# Patient Record
Sex: Female | Born: 1995 | Race: White | Hispanic: No | Marital: Single | Smoking: Never smoker
Health system: Southern US, Community
[De-identification: ages and names within clinical notes are randomized; demographics above are authoritative.]

## PROBLEM LIST (undated history)

## (undated) DIAGNOSIS — F419 Anxiety disorder, unspecified: Secondary | ICD-10-CM

## (undated) DIAGNOSIS — F988 Other specified behavioral and emotional disorders with onset usually occurring in childhood and adolescence: Secondary | ICD-10-CM

## (undated) DIAGNOSIS — F329 Major depressive disorder, single episode, unspecified: Secondary | ICD-10-CM

## (undated) HISTORY — DX: Major depressive disorder, single episode, unspecified: F32.9

## (undated) HISTORY — PX: MOUTH SURGERY: SHX715

---
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
## 2016-01-03 ENCOUNTER — Other Ambulatory Visit: Payer: Self-pay | Admitting: Obstetrics & Gynecology

## 2016-01-06 ENCOUNTER — Ambulatory Visit (INDEPENDENT_AMBULATORY_CARE_PROVIDER_SITE_OTHER): Payer: Managed Care, Other (non HMO) | Admitting: Family

## 2016-01-06 VITALS — BP 106/70 | HR 84 | Temp 97.8°F | Wt 142.0 lb

## 2016-01-06 DIAGNOSIS — Z3402 Encounter for supervision of normal first pregnancy, second trimester: Secondary | ICD-10-CM

## 2016-01-06 DIAGNOSIS — J029 Acute pharyngitis, unspecified: Secondary | ICD-10-CM

## 2016-01-06 DIAGNOSIS — O219 Vomiting of pregnancy, unspecified: Secondary | ICD-10-CM

## 2016-01-06 MED ORDER — AZITHROMYCIN 250 MG PO TABS: ORAL_TABLET | ORAL | 0 refills | Status: DC

## 2016-01-06 MED ORDER — PROMETHAZINE HCL 12.5 MG PO TABS: 12.5000 mg | ORAL_TABLET | Freq: Four times a day (QID) | ORAL | 1 refills | Status: DC | PRN

## 2016-01-06 NOTE — Progress Notes (Signed)
   PRENATAL VISIT NOTE  Subjective:  Amanda Simmons is a 20 y.o. G1P0 at 167w4d being seen today for ongoing prenatal care.  She is currently monitored for the following issues for this low-risk pregnancy and has Seborrhea-like dermatitis with psoriasiform elements; Attention deficit hyperactivity disorder (ADHD); Pregnancy; Nausea without vomiting; Constipation; Depression; Supervision of normal first pregnancy; Depressed mood; and Bereavement on her problem list.  Patient reports sore throat, coughing, and sneezing x 3 days.  Contractions: Not present. Vag. Bleeding: None.  Movement: Present. Denies leaking of fluid.   The following portions of the patient's history were reviewed and updated as appropriate: allergies, current medications, past family history, past medical history, past social history, past surgical history and problem list. Problem list updated.  Objective:   Vitals:   01/06/16 0956  BP: 106/70  Pulse: 84  Temp: 97.8 F (36.6 C)  Weight: 142 lb (64.4 kg)    Fetal Status: Fetal Heart Rate (bpm): 141  Fundal Height: 27 cm Movement: Present     General:  Alert, oriented and cooperative. Patient is in no acute distress.  HEENT Throat red, +cobblestoning  Skin: Skin is warm and dry. No rash noted.   Cardiovascular: Normal heart rate noted  Respiratory: Normal respiratory effort, no problems with respiration noted; lungs clear throughout auscultation  Abdomen: Soft, gravid, appropriate for gestational age. Pain/Pressure: Present     Pelvic:  Cervical exam deferred        Extremities: Normal range of motion.  Edema: None  Mental Status: Normal mood and affect. Normal behavior. Normal judgment and thought content.   Assessment and Plan:  Pregnancy: G1P0 at 397w4d  1. Encounter for supervision of normal first pregnancy in second trimester - Plans to return for third trimester labs  2. Pharyngitis, unspecified etiology - azithromycin (ZITHROMAX Z-PAK) 250 MG tablet;  Take two pills on day one, then one pill a day x 4 days.  Dispense: 6 tablet; Refill: 0  3. Nausea and vomiting in pregnancy - promethazine (PHENERGAN) 12.5 MG tablet; Take 1 tablet (12.5 mg total) by mouth every 6 (six) hours as needed for nausea.  Dispense: 30 tablet; Refill: 1  Preterm labor symptoms and general obstetric precautions including but not limited to vaginal bleeding, contractions, leaking of fluid and fetal movement were reviewed in detail with the patient. Please refer to After Visit Summary for other counseling recommendations.  Return in about 2 weeks (around 01/20/2016).   Eino FarberWalidah Kennith GainN Karim, CNM

## 2016-01-06 NOTE — Patient Instructions (Signed)

## 2016-01-20 ENCOUNTER — Ambulatory Visit (INDEPENDENT_AMBULATORY_CARE_PROVIDER_SITE_OTHER): Payer: Managed Care, Other (non HMO) | Admitting: Advanced Practice Midwife

## 2016-01-20 VITALS — BP 120/76 | HR 92 | Wt 150.0 lb

## 2016-01-20 DIAGNOSIS — Z3492 Encounter for supervision of normal pregnancy, unspecified, second trimester: Secondary | ICD-10-CM

## 2016-01-20 LAB — CBC
HCT: 28.3 % — ABNORMAL LOW (ref 35.0–45.0)
Hemoglobin: 9.4 g/dL — ABNORMAL LOW (ref 11.7–15.5)
MCH: 27.8 pg (ref 27.0–33.0)
MCHC: 33.2 g/dL (ref 32.0–36.0)
MCV: 83.7 fL (ref 80.0–100.0)
MPV: 9.6 fL (ref 7.5–12.5)
PLATELETS: 238 10*3/uL (ref 140–400)
RBC: 3.38 MIL/uL — AB (ref 3.80–5.10)
RDW: 14.1 % (ref 11.0–15.0)
WBC: 7.8 10*3/uL (ref 3.8–10.8)

## 2016-01-20 NOTE — Patient Instructions (Signed)
Third Trimester of Pregnancy The third trimester is from week 29 through week 40 (months 7 through 9). The third trimester is a time when the unborn baby (fetus) is growing rapidly. At the end of the ninth month, the fetus is about 20 inches in length and weighs 6-10 pounds. Body changes during your third trimester Your body goes through many changes during pregnancy. The changes vary from woman to woman. During the third trimester:  Your weight will continue to increase. You can expect to gain 25-35 pounds (11-16 kg) by the end of the pregnancy.  You may begin to get stretch marks on your hips, abdomen, and breasts.  You may urinate more often because the fetus is moving lower into your pelvis and pressing on your bladder.  You may develop or continue to have heartburn. This is caused by increased hormones that slow down muscles in the digestive tract.  You may develop or continue to have constipation because increased hormones slow digestion and cause the muscles that push waste through your intestines to relax.  You may develop hemorrhoids. These are swollen veins (varicose veins) in the rectum that can itch or be painful.  You may develop swollen, bulging veins (varicose veins) in your legs.  You may have increased body aches in the pelvis, back, or thighs. This is due to weight gain and increased hormones that are relaxing your joints.  You may have changes in your hair. These can include thickening of your hair, rapid growth, and changes in texture. Some women also have hair loss during or after pregnancy, or hair that feels dry or thin. Your hair will most likely return to normal after your baby is born.  Your breasts will continue to grow and they will continue to become tender. A yellow fluid (colostrum) may leak from your breasts. This is the first milk you are producing for your baby.  Your belly button may stick out.  You may notice more swelling in your hands, face, or  ankles.  You may have increased tingling or numbness in your hands, arms, and legs. The skin on your belly may also feel numb.  You may feel short of breath because of your expanding uterus.  You may have more problems sleeping. This can be caused by the size of your belly, increased need to urinate, and an increase in your body's metabolism.  You may notice the fetus "dropping," or moving lower in your abdomen.  You may have increased vaginal discharge.  Your cervix becomes thin and soft (effaced) near your due date. What to expect at prenatal visits You will have prenatal exams every 2 weeks until week 36. Then you will have weekly prenatal exams. During a routine prenatal visit:  You will be weighed to make sure you and the fetus are growing normally.  Your blood pressure will be taken.  Your abdomen will be measured to track your baby's growth.  The fetal heartbeat will be listened to.  Any test results from the previous visit will be discussed.  You may have a cervical check near your due date to see if you have effaced. At around 36 weeks, your health care provider will check your cervix. At the same time, your health care provider will also perform a test on the secretions of the vaginal tissue. This test is to determine if a type of bacteria, Group B streptococcus, is present. Your health care provider will explain this further. Your health care provider may ask you:    What your birth plan is.  How you are feeling.  If you are feeling the baby move.  If you have had any abnormal symptoms, such as leaking fluid, bleeding, severe headaches, or abdominal cramping.  If you are using any tobacco products, including cigarettes, chewing tobacco, and electronic cigarettes.  If you have any questions. Other tests or screenings that may be performed during your third trimester include:  Blood tests that check for low iron levels (anemia).  Fetal testing to check the health,  activity level, and growth of the fetus. Testing is done if you have certain medical conditions or if there are problems during the pregnancy.  Nonstress test (NST). This test checks the health of your baby to make sure there are no signs of problems, such as the baby not getting enough oxygen. During this test, a belt is placed around your belly. The baby is made to move, and its heart rate is monitored during movement. What is false labor? False labor is a condition in which you feel small, irregular tightenings of the muscles in the womb (contractions) that eventually go away. These are called Braxton Hicks contractions. Contractions may last for hours, days, or even weeks before true labor sets in. If contractions come at regular intervals, become more frequent, increase in intensity, or become painful, you should see your health care provider. What are the signs of labor?  Abdominal cramps.  Regular contractions that start at 10 minutes apart and become stronger and more frequent with time.  Contractions that start on the top of the uterus and spread down to the lower abdomen and back.  Increased pelvic pressure and dull back pain.  A watery or bloody mucus discharge that comes from the vagina.  Leaking of amniotic fluid. This is also known as your "water breaking." It could be a slow trickle or a gush. Let your doctor know if it has a color or strange odor. If you have any of these signs, call your health care provider right away, even if it is before your due date. Follow these instructions at home: Eating and drinking  Continue to eat regular, healthy meals.  Do not eat:  Raw meat or meat spreads.  Unpasteurized milk or cheese.  Unpasteurized juice.  Store-made salad.  Refrigerated smoked seafood.  Hot dogs or deli meat, unless they are piping hot.  More than 6 ounces of albacore tuna a week.  Shark, swordfish, king mackerel, or tile fish.  Store-made salads.  Raw  sprouts, such as mung bean or alfalfa sprouts.  Take prenatal vitamins as told by your health care provider.  Take 1000 mg of calcium daily as told by your health care provider.  If you develop constipation:  Take over-the-counter or prescription medicines.  Drink enough fluid to keep your urine clear or pale yellow.  Eat foods that are high in fiber, such as fresh fruits and vegetables, whole grains, and beans.  Limit foods that are high in fat and processed sugars, such as fried and sweet foods. Activity  Exercise only as directed by your health care provider. Healthy pregnant women should aim for 2 hours and 30 minutes of moderate exercise per week. If you experience any pain or discomfort while exercising, stop.  Avoid heavy lifting.  Do not exercise in extreme heat or humidity, or at high altitudes.  Wear low-heel, comfortable shoes.  Practice good posture.  Do not travel far distances unless it is absolutely necessary and only with the approval   of your health care provider.  Wear your seat belt at all times while in a car, on a bus, or on a plane.  Take frequent breaks and rest with your legs elevated if you have leg cramps or low back pain.  Do not use hot tubs, steam rooms, or saunas.  You may continue to have sex unless your health care provider tells you otherwise. Lifestyle  Do not use any products that contain nicotine or tobacco, such as cigarettes and e-cigarettes. If you need help quitting, ask your health care provider.  Do not drink alcohol.  Do not use any medicinal herbs or unprescribed drugs. These chemicals affect the formation and growth of the baby.  If you develop varicose veins:  Wear support pantyhose or compression stockings as told by your healthcare provider.  Elevate your feet for 15 minutes, 3-4 times a day.  Wear a supportive maternity bra to help with breast tenderness. General instructions  Take over-the-counter and prescription  medicines only as told by your health care provider. There are medicines that are either safe or unsafe to take during pregnancy.  Take warm sitz baths to soothe any pain or discomfort caused by hemorrhoids. Use hemorrhoid cream or witch hazel if your health care provider approves.  Avoid cat litter boxes and soil used by cats. These carry germs that can cause birth defects in the baby. If you have a cat, ask someone to clean the litter box for you.  To prepare for the arrival of your baby:  Take prenatal classes to understand, practice, and ask questions about the labor and delivery.  Make a trial run to the hospital.  Visit the hospital and tour the maternity area.  Arrange for maternity or paternity leave through employers.  Arrange for family and friends to take care of pets while you are in the hospital.  Purchase a rear-facing car seat and make sure you know how to install it in your car.  Pack your hospital bag.  Prepare the baby's nursery. Make sure to remove all pillows and stuffed animals from the baby's crib to prevent suffocation.  Visit your dentist if you have not gone during your pregnancy. Use a soft toothbrush to brush your teeth and be gentle when you floss.  Keep all prenatal follow-up visits as told by your health care provider. This is important. Contact a health care provider if:  You are unsure if you are in labor or if your water has broken.  You become dizzy.  You have mild pelvic cramps, pelvic pressure, or nagging pain in your abdominal area.  You have lower back pain.  You have persistent nausea, vomiting, or diarrhea.  You have an unusual or bad smelling vaginal discharge.  You have pain when you urinate. Get help right away if:  You have a fever.  You are leaking fluid from your vagina.  You have spotting or bleeding from your vagina.  You have severe abdominal pain or cramping.  You have rapid weight loss or weight gain.  You have  shortness of breath with chest pain.  You notice sudden or extreme swelling of your face, hands, ankles, feet, or legs.  Your baby makes fewer than 10 movements in 2 hours.  You have severe headaches that do not go away with medicine.  You have vision changes. Summary  The third trimester is from week 29 through week 40, months 7 through 9. The third trimester is a time when the unborn baby (fetus)   is growing rapidly.  During the third trimester, your discomfort may increase as you and your baby continue to gain weight. You may have abdominal, leg, and back pain, sleeping problems, and an increased need to urinate.  During the third trimester your breasts will keep growing and they will continue to become tender. A yellow fluid (colostrum) may leak from your breasts. This is the first milk you are producing for your baby.  False labor is a condition in which you feel small, irregular tightenings of the muscles in the womb (contractions) that eventually go away. These are called Braxton Hicks contractions. Contractions may last for hours, days, or even weeks before true labor sets in.  Signs of labor can include: abdominal cramps; regular contractions that start at 10 minutes apart and become stronger and more frequent with time; watery or bloody mucus discharge that comes from the vagina; increased pelvic pressure and dull back pain; and leaking of amniotic fluid. This information is not intended to replace advice given to you by your health care provider. Make sure you discuss any questions you have with your health care provider. Document Released: 01/16/2001 Document Revised: 06/30/2015 Document Reviewed: 03/25/2012 Elsevier Interactive Patient Education  2017 [employer]  

## 2016-01-20 NOTE — Progress Notes (Signed)
   PRENATAL VISIT NOTE  Subjective:  Amanda Simmons is a 20 y.o. G1P0 at 6875w4d being seen today for ongoing prenatal care.  She is currently monitored for the following issues for this low-risk pregnancy and has Seborrhea-like dermatitis with psoriasiform elements; Attention deficit hyperactivity disorder (ADHD); Pregnancy; Nausea without vomiting; Constipation; Depression; Supervision of normal first pregnancy; Depressed mood; and Bereavement on her problem list.  Patient reports nausea since glucose drink for GTT.  Contractions: Not present. Vag. Bleeding: None.  Movement: Present. Denies leaking of fluid.   The following portions of the patient's history were reviewed and updated as appropriate: allergies, current medications, past family history, past medical history, past social history, past surgical history and problem list. Problem list updated.  Objective:   Vitals:   01/20/16 0853  BP: 120/76  Pulse: 92  Weight: 150 lb (68 kg)    Fetal Status: Fetal Heart Rate (bpm): 146 Fundal Height: 27 cm Movement: Present     General:  Alert, oriented and cooperative. Patient is in no acute distress.  Skin: Skin is warm and dry. No rash noted.   Cardiovascular: Normal heart rate noted  Respiratory: Normal respiratory effort, no problems with respiration noted  Abdomen: Soft, gravid, appropriate for gestational age. Pain/Pressure: Absent     Pelvic:  Cervical exam deferred        Extremities: Normal range of motion.  Edema: None  Mental Status: Normal mood and affect. Normal behavior. Normal judgment and thought content.   Assessment and Plan:  Pregnancy: G1P0 at 4475w4d  1. Normal pregnancy in second trimester  - Glucose Tolerance, 2 Hours w/1 Hour - HIV antibody (with reflex) - RPR - CBC - Tdap vaccine greater than or equal to 7yo IM  Preterm labor symptoms and general obstetric precautions including but not limited to vaginal bleeding, contractions, leaking of fluid and  fetal movement were reviewed in detail with the patient. Please refer to After Visit Summary for other counseling recommendations.  Return in about 2 weeks (around 02/03/2016).   Amanda Simmons, CNM

## 2016-01-21 LAB — RPR

## 2016-01-21 LAB — GLUCOSE TOLERANCE, 2 HOURS W/ 1HR
Glucose, 1 hour: 122 mg/dL
Glucose, 2 hour: 91 mg/dL (ref <140)
Glucose, Fasting: 70 mg/dL (ref 65–99)

## 2016-01-21 LAB — HIV ANTIBODY (ROUTINE TESTING W REFLEX): HIV: NONREACTIVE

## 2016-01-23 ENCOUNTER — Telehealth: Payer: Self-pay | Admitting: *Deleted

## 2016-01-23 NOTE — Telephone Encounter (Signed)
LM on voicemail of normal 2 hr GTt and her Hgb is low @ 9.4  She is encouraged to add Fe so4 to her daily mes.

## 2016-02-06 NOTE — L&D Delivery Note (Signed)
Delivery Note At 7:44 PM a viable female was delivered via Vaginal, Spontaneous Delivery (presentation: vertex ; ROA).  APGAR: 8, 9; weight pending.   Placenta status: spontaneous, intact .  Cord: 3 vessel, 60s delay on clamping.  With no complications: .  Cord pH: n/a Sterile attire worn for the entire procedure Anesthesia:  Epidural + lidocaine Episiotomy: None Lacerations: Labial Suture Repair: 4.0 monocryl Est. Blood Loss (mL):  100  Mom to postpartum.  Baby to Couplet care / Skin to Skin.  Nehemiah SettleAna Carvalho do Silvio ClaymanAmaral MD PGY1 04/14/2016, 8:07 PM  OB FELLOW DELIVERY ATTESTATION  I was gloved and present for the delivery in its entirety, and I agree with the above resident's note.    Jen MowElizabeth Zaliyah Meikle, DO OB Fellow

## 2016-02-08 ENCOUNTER — Other Ambulatory Visit: Payer: Self-pay | Admitting: *Deleted

## 2016-02-08 DIAGNOSIS — O219 Vomiting of pregnancy, unspecified: Secondary | ICD-10-CM

## 2016-02-08 MED ORDER — PROMETHAZINE HCL 12.5 MG PO TABS: 12.5000 mg | ORAL_TABLET | Freq: Four times a day (QID) | ORAL | 1 refills | Status: DC | PRN

## 2016-02-08 NOTE — Telephone Encounter (Signed)
Pt called with request for RF on Phenergan.  Per Dr Marice Potterove RF sent to [facility] in [place].

## 2016-02-09 ENCOUNTER — Ambulatory Visit (INDEPENDENT_AMBULATORY_CARE_PROVIDER_SITE_OTHER): Payer: Managed Care, Other (non HMO) | Admitting: Obstetrics & Gynecology

## 2016-02-09 VITALS — BP 113/69 | HR 88 | Wt 152.0 lb

## 2016-02-09 DIAGNOSIS — Z3403 Encounter for supervision of normal first pregnancy, third trimester: Secondary | ICD-10-CM

## 2016-02-09 NOTE — Progress Notes (Signed)
   PRENATAL VISIT NOTE  Subjective:  Amanda Simmons is a 21 y.o. G1P0 at 8157w3d being seen today for ongoing prenatal care.  She is currently monitored for the following issues for this low-risk pregnancy and has Seborrhea-like dermatitis with psoriasiform elements; Attention deficit hyperactivity disorder (ADHD); Pregnancy; Nausea without vomiting; Constipation; Depression; Supervision of normal first pregnancy; Depressed mood; and Bereavement on her problem list.  Patient reports heartburn.  Contractions: Not present. Vag. Bleeding: None.  Movement: Present. Denies leaking of fluid.   The following portions of the patient's history were reviewed and updated as appropriate: allergies, current medications, past family history, past medical history, past social history, past surgical history and problem list. Problem list updated.  Objective:   Vitals:   02/09/16 1422  BP: 113/69  Pulse: 88  Weight: 152 lb (68.9 kg)    Fetal Status: Fetal Heart Rate (bpm): 147   Movement: Present     General:  Alert, oriented and cooperative. Patient is in no acute distress.  Skin: Skin is warm and dry. No rash noted.   Cardiovascular: Normal heart rate noted  Respiratory: Normal respiratory effort, no problems with respiration noted  Abdomen: Soft, gravid, appropriate for gestational age. Pain/Pressure: Present     Pelvic:  Cervical exam deferred        Extremities: Normal range of motion.  Edema: Trace  Mental Status: Normal mood and affect. Normal behavior. Normal judgment and thought content.   Assessment and Plan:  Pregnancy: G1P0 at 6557w3d  1. Encounter for supervision of normal first pregnancy in third trimester - Rec trial of zantac OTC qhs  Preterm labor symptoms and general obstetric precautions including but not limited to vaginal bleeding, contractions, leaking of fluid and fetal movement were reviewed in detail with the patient. Please refer to After Visit Summary for other  counseling recommendations.  No Follow-up on file.   Allie BossierMyra C Marilyn Nihiser, MD

## 2016-02-22 ENCOUNTER — Encounter: Payer: Self-pay | Admitting: Obstetrics & Gynecology

## 2016-02-29 ENCOUNTER — Ambulatory Visit (INDEPENDENT_AMBULATORY_CARE_PROVIDER_SITE_OTHER): Payer: Managed Care, Other (non HMO) | Admitting: Obstetrics & Gynecology

## 2016-02-29 VITALS — BP 118/73 | HR 84 | Wt 153.0 lb

## 2016-02-29 DIAGNOSIS — O9989 Other specified diseases and conditions complicating pregnancy, childbirth and the puerperium: Secondary | ICD-10-CM | POA: Diagnosis not present

## 2016-02-29 DIAGNOSIS — N898 Other specified noninflammatory disorders of vagina: Secondary | ICD-10-CM

## 2016-02-29 DIAGNOSIS — Z3403 Encounter for supervision of normal first pregnancy, third trimester: Secondary | ICD-10-CM

## 2016-02-29 NOTE — Progress Notes (Signed)
Pt states that discharge is thick and has an odor. She feels like she may have BV or yeast.

## 2016-02-29 NOTE — Progress Notes (Signed)
   PRENATAL VISIT NOTE  Subjective:  Amanda Simmons is a 21 y.o. SW G1P0 at 4850w2d being seen today for ongoing prenatal care.  Amanda Simmons is currently monitored for the following issues for this low-risk pregnancy and has Seborrhea-like dermatitis with psoriasiform elements; Attention deficit hyperactivity disorder (ADHD); Pregnancy; Nausea without vomiting; Constipation; Depression; Supervision of normal first pregnancy; Depressed mood; and Bereavement on her problem list.  Patient reports vaginal irritation.  Contractions: Not present. Vag. Bleeding: None.  Movement: Present. Denies leaking of fluid.   The following portions of the patient's history were reviewed and updated as appropriate: allergies, current medications, past family history, past medical history, past social history, past surgical history and problem list. Problem list updated.  Objective:   Vitals:   02/29/16 0831  BP: 118/73  Pulse: 84  Weight: 153 lb (69.4 kg)    Fetal Status:     Movement: Present     General:  Alert, oriented and cooperative. Patient is in no acute distress.  Skin: Skin is warm and dry. No rash noted.   Cardiovascular: Normal heart rate noted  Respiratory: Normal respiratory effort, no problems with respiration noted  Abdomen: Soft, gravid, appropriate for gestational age. Pain/Pressure: Present     Pelvic:  Cervical exam deferred       vaginal discharge appears normal but wet prep sent  Extremities: Normal range of motion.  Edema: None  Mental Status: Normal mood and affect. Normal behavior. Normal judgment and thought content.   Assessment and Plan:  Pregnancy: G1P0 at 6850w2d  1. Encounter for supervision of normal first pregnancy in third trimester - wet prep sent  Preterm labor symptoms and general obstetric precautions including but not limited to vaginal bleeding, contractions, leaking of fluid and fetal movement were reviewed in detail with the patient. Please refer to After Visit  Summary for other counseling recommendations.  No Follow-up on file.   Allie BossierMyra C Tamir Wallman, MD

## 2016-02-29 NOTE — Addendum Note (Signed)
Addended by: Kathie DikeSOLA, Paydon Carll J on: 02/29/2016 08:44 AM   Modules accepted: Orders

## 2016-03-01 LAB — CERVICOVAGINAL ANCILLARY ONLY
Bacterial vaginitis: NEGATIVE
CANDIDA VAGINITIS: NEGATIVE

## 2016-03-14 ENCOUNTER — Ambulatory Visit (INDEPENDENT_AMBULATORY_CARE_PROVIDER_SITE_OTHER): Payer: Managed Care, Other (non HMO) | Admitting: Obstetrics & Gynecology

## 2016-03-14 DIAGNOSIS — O219 Vomiting of pregnancy, unspecified: Secondary | ICD-10-CM

## 2016-03-14 DIAGNOSIS — Z3403 Encounter for supervision of normal first pregnancy, third trimester: Secondary | ICD-10-CM

## 2016-03-14 MED ORDER — PROMETHAZINE HCL 12.5 MG PO TABS: 12.5000 mg | ORAL_TABLET | Freq: Four times a day (QID) | ORAL | 1 refills | Status: DC | PRN

## 2016-03-14 NOTE — Progress Notes (Signed)
   PRENATAL VISIT NOTE  Subjective:  Amanda Simmons is a 21 y.o. G1P0 at 717w2d being seen today for ongoing prenatal care.  She is currently monitored for the following issues for this low-risk pregnancy and has Seborrhea-like dermatitis with psoriasiform elements; Attention deficit hyperactivity disorder (ADHD); Nausea without vomiting; Constipation; Depression; Supervision of normal first pregnancy; and Bereavement on her problem list.  Patient reports no complaints.  Contractions: Not present. Vag. Bleeding: None.  Movement: Present. Denies leaking of fluid.   The following portions of the patient's history were reviewed and updated as appropriate: allergies, current medications, past family history, past medical history, past social history, past surgical history and problem list. Problem list updated.  Objective:   Vitals:   03/14/16 1036  BP: 121/75  Pulse: 95  Weight: 155 lb (70.3 kg)    Fetal Status: Fetal Heart Rate (bpm): 146   Movement: Present     General:  Alert, oriented and cooperative. Patient is in no acute distress.  Skin: Skin is warm and dry. No rash noted.   Cardiovascular: Normal heart rate noted  Respiratory: Normal respiratory effort, no problems with respiration noted  Abdomen: Soft, gravid, appropriate for gestational age. Pain/Pressure: Present     Pelvic:  Cervical exam deferred        Extremities: Normal range of motion.  Edema: None  Mental Status: Normal mood and affect. Normal behavior. Normal judgment and thought content.   Assessment and Plan:  Pregnancy: G1P0 at 547w2d  1. Encounter for supervision of normal first pregnancy in third trimester Cultures next visit  2. Nausea and vomiting in pregnancy - promethazine (PHENERGAN) 12.5 MG tablet; Take 1 tablet (12.5 mg total) by mouth every 6 (six) hours as needed for nausea.  Dispense: 30 tablet; Refill: 1  Preterm labor symptoms and general obstetric precautions including but not limited to  vaginal bleeding, contractions, leaking of fluid and fetal movement were reviewed in detail with the patient. Please refer to After Visit Summary for other counseling recommendations.  Return in about 1 year (around 03/14/2017).   Lesly DukesKelly H Alanson Hausmann, MD

## 2016-03-23 ENCOUNTER — Ambulatory Visit (INDEPENDENT_AMBULATORY_CARE_PROVIDER_SITE_OTHER): Payer: Managed Care, Other (non HMO) | Admitting: Advanced Practice Midwife

## 2016-03-23 ENCOUNTER — Other Ambulatory Visit (HOSPITAL_COMMUNITY)
Admission: RE | Admit: 2016-03-23 | Discharge: 2016-03-23 | Disposition: A | Discharge status: Home / Self Care | Payer: Managed Care, Other (non HMO) | Source: Ambulatory Visit | Attending: Advanced Practice Midwife | Admitting: Advanced Practice Midwife

## 2016-03-23 VITALS — BP 119/72 | HR 98 | Wt 155.0 lb

## 2016-03-23 DIAGNOSIS — Z113 Encounter for screening for infections with a predominantly sexual mode of transmission: Secondary | ICD-10-CM

## 2016-03-23 DIAGNOSIS — Z3493 Encounter for supervision of normal pregnancy, unspecified, third trimester: Secondary | ICD-10-CM | POA: Diagnosis not present

## 2016-03-23 LAB — OB RESULTS CONSOLE GBS: GBS: NEGATIVE

## 2016-03-23 MED ORDER — CYCLOBENZAPRINE HCL 5 MG PO TABS: 5.0000 mg | ORAL_TABLET | Freq: Three times a day (TID) | ORAL | 0 refills | Status: DC | PRN

## 2016-03-23 NOTE — Progress Notes (Signed)
neg

## 2016-03-23 NOTE — Patient Instructions (Signed)

## 2016-03-23 NOTE — Progress Notes (Signed)
   PRENATAL VISIT NOTE  Subjective:  Amanda Simmons is a 21 y.o. G1P0 at 6264w4d being seen today for ongoing prenatal care.  She is currently monitored for the following issues for this low-risk pregnancy and has Seborrhea-like dermatitis with psoriasiform elements; Attention deficit hyperactivity disorder (ADHD); Nausea without vomiting; Constipation; Depression; Supervision of normal first pregnancy; and Bereavement on her problem list.  Patient reports no complaints.  Contractions: Regular. Vag. Bleeding: None.  Movement: Present. Denies leaking of fluid.   The following portions of the patient's history were reviewed and updated as appropriate: allergies, current medications, past family history, past medical history, past social history, past surgical history and problem list. Problem list updated.  Objective:   Vitals:   03/23/16 1026  BP: 119/72  Pulse: 98  Weight: 155 lb (70.3 kg)    Fetal Status: Fetal Heart Rate (bpm): 143   Movement: Present     General:  Alert, oriented and cooperative. Patient is in no acute distress.  Skin: Skin is warm and dry. No rash noted.   Cardiovascular: Normal heart rate noted  Respiratory: Normal respiratory effort, no problems with respiration noted  Abdomen: Soft, gravid, appropriate for gestational age. Pain/Pressure: Present     Pelvic:  Cervical exam deferred        Extremities: Normal range of motion.  Edema: None  Mental Status: Normal mood and affect. Normal behavior. Normal judgment and thought content.   Assessment and Plan:  Pregnancy: G1P0 at 4264w4d  1. Normal pregnancy in third trimester  - Urine cytology ancillary only - Culture, beta strep (group b only)  Preterm labor symptoms and general obstetric precautions including but not limited to vaginal bleeding, contractions, leaking of fluid and fetal movement were reviewed in detail with the patient. Please refer to After Visit Summary for other counseling recommendations.    RTO 1 week  Aviva SignsMarie L Suvan Stcyr, CNM

## 2016-03-25 LAB — CULTURE, BETA STREP (GROUP B ONLY)

## 2016-03-26 LAB — URINE CYTOLOGY ANCILLARY ONLY
CHLAMYDIA, DNA PROBE: NEGATIVE
Neisseria Gonorrhea: NEGATIVE

## 2016-03-30 ENCOUNTER — Ambulatory Visit (INDEPENDENT_AMBULATORY_CARE_PROVIDER_SITE_OTHER): Payer: Managed Care, Other (non HMO) | Admitting: Advanced Practice Midwife

## 2016-03-30 VITALS — BP 118/79 | HR 95 | Wt 158.0 lb

## 2016-03-30 DIAGNOSIS — Z3403 Encounter for supervision of normal first pregnancy, third trimester: Secondary | ICD-10-CM

## 2016-03-30 NOTE — Patient Instructions (Signed)
Braxton Hicks Contractions °Contractions of the uterus can occur throughout pregnancy. Contractions are not always a sign that you are in labor.  °WHAT ARE BRAXTON HICKS CONTRACTIONS?  °Contractions that occur before labor are called Braxton Hicks contractions, or false labor. Toward the end of pregnancy (32-34 weeks), these contractions can develop more often and may become more forceful. This is not true labor because these contractions do not result in opening (dilatation) and thinning of the cervix. They are sometimes difficult to tell apart from true labor because these contractions can be forceful and people have different pain tolerances. You should not feel embarrassed if you go to the hospital with false labor. Sometimes, the only way to tell if you are in true labor is for your health care provider to look for changes in the cervix. °If there are no prenatal problems or other health problems associated with the pregnancy, it is completely safe to be sent home with false labor and await the onset of true labor. °HOW CAN YOU TELL THE DIFFERENCE BETWEEN TRUE AND FALSE LABOR? °False Labor  °· The contractions of false labor are usually shorter and not as hard as those of true labor.   °· The contractions are usually irregular.   °· The contractions are often felt in the front of the lower abdomen and in the groin.   °· The contractions may go away when you walk around or change positions while lying down.   °· The contractions get weaker and are shorter lasting as time goes on.   °· The contractions do not usually become progressively stronger, regular, and closer together as with true labor.   °True Labor  °· Contractions in true labor last 30-70 seconds, become very regular, usually become more intense, and increase in frequency.   °· The contractions do not go away with walking.   °· The discomfort is usually felt in the top of the uterus and spreads to the lower abdomen and low back.   °· True labor can be  determined by your health care provider with an exam. This will show that the cervix is dilating and getting thinner.   °WHAT TO REMEMBER °· Keep up with your usual exercises and follow other instructions given by your health care provider.   °· Take medicines as directed by your health care provider.   °· Keep your regular prenatal appointments.   °· Eat and drink lightly if you think you are going into labor.   °· If Braxton Hicks contractions are making you uncomfortable:   °¨ Change your position from lying down or resting to walking, or from walking to resting.   °¨ Sit and rest in a tub of warm water.   °¨ Drink 2-3 glasses of water. Dehydration may cause these contractions.   °¨ Do slow and deep breathing several times an hour.   °WHEN SHOULD I SEEK IMMEDIATE MEDICAL CARE? °Seek immediate medical care if: °· Your contractions become stronger, more regular, and closer together.   °· You have fluid leaking or gushing from your vagina.   °· You have a fever.   °· You pass blood-tinged mucus.   °· You have vaginal bleeding.   °· You have continuous abdominal pain.   °· You have low back pain that you never had before.   °· You feel your baby's head pushing down and causing pelvic pressure.   °· Your baby is not moving as much as it used to.   °This information is not intended to replace advice given to you by your health care provider. Make sure you discuss any questions you have with your health care   provider. °Document Released: 01/22/2005 Document Revised: 05/16/2015 Document Reviewed: 11/03/2012 °Elsevier Interactive Patient Education © 2017 Elsevier Inc[employer] ° °

## 2016-03-30 NOTE — Progress Notes (Signed)
   PRENATAL VISIT NOTE  Subjective:  Amanda Simmons is a 21 y.o. G1P0 at 10496w4d being seen today for ongoing prenatal care.  She is currently monitored for the following issues for this low-risk pregnancy and has Seborrhea-like dermatitis with psoriasiform elements; Attention deficit hyperactivity disorder (ADHD); Nausea without vomiting; Constipation; Depression; Supervision of normal first pregnancy; and Bereavement on her problem list.  Patient reports occasional contractions.  Contractions: Irregular. Vag. Bleeding: None.  Movement: Present. Denies leaking of fluid.   The following portions of the patient's history were reviewed and updated as appropriate: allergies, current medications, past family history, past medical history, past social history, past surgical history and problem list. Problem list updated.  Objective:   Vitals:   03/30/16 1042  BP: 118/79  Pulse: 95  Weight: 158 lb (71.7 kg)    Fetal Status: Fetal Heart Rate (bpm): 148 Fundal Height: 38 cm Movement: Present  Presentation: Vertex  General:  Alert, oriented and cooperative. Patient is in no acute distress.  Skin: Skin is warm and dry. No rash noted.   Cardiovascular: Normal heart rate noted  Respiratory: Normal respiratory effort, no problems with respiration noted  Abdomen: Soft, gravid, appropriate for gestational age. Pain/Pressure: Present     Pelvic:  Cervical exam deferred        Extremities: Normal range of motion.  Edema: None  Mental Status: Normal mood and affect. Normal behavior. Normal judgment and thought content.  Reviewed neg GBS Assessment and Plan:  Pregnancy: G1P0 at 4696w4d  1. Encounter for supervision of normal first pregnancy in third trimester   Term labor symptoms and general obstetric precautions including but not limited to vaginal bleeding, contractions, leaking of fluid and fetal movement were reviewed in detail with the patient. Please refer to After Visit Summary for other  counseling recommendations.  Return in about 1 week (around 04/06/2016) for ROB.   Dorathy KinsmanVirginia Mckyla Deckman, CNM

## 2016-04-06 ENCOUNTER — Ambulatory Visit (INDEPENDENT_AMBULATORY_CARE_PROVIDER_SITE_OTHER): Payer: Managed Care, Other (non HMO) | Admitting: Advanced Practice Midwife

## 2016-04-06 DIAGNOSIS — Z3403 Encounter for supervision of normal first pregnancy, third trimester: Secondary | ICD-10-CM

## 2016-04-06 NOTE — Patient Instructions (Signed)

## 2016-04-08 NOTE — Progress Notes (Signed)
   PRENATAL VISIT NOTE  Subjective:  Amanda Simmons is a 21 y.o. G1P0 at 6244w4d being seen today for ongoing prenatal care.  She is currently monitored for the following issues for this low-risk pregnancy and has Seborrhea-like dermatitis with psoriasiform elements; Attention deficit hyperactivity disorder (ADHD); Nausea without vomiting; Constipation; Depression; Supervision of normal first pregnancy; and Bereavement on her problem list.  Patient reports occasional contractions.  Contractions: Irregular. Vag. Bleeding: None.  Movement: Present. Denies leaking of fluid.   The following portions of the patient's history were reviewed and updated as appropriate: allergies, current medications, past family history, past medical history, past social history, past surgical history and problem list. Problem list updated.  Objective:   Vitals:   04/06/16 1040  BP: 128/80  Pulse: (!) 108  Weight: 160 lb (72.6 kg)    Fetal Status: Fetal Heart Rate (bpm): 151 Fundal Height: 39 cm Movement: Present     General:  Alert, oriented and cooperative. Patient is in no acute distress.  Skin: Skin is warm and dry. No rash noted.   Cardiovascular: Normal heart rate noted  Respiratory: Normal respiratory effort, no problems with respiration noted  Abdomen: Soft, gravid, appropriate for gestational age. Pain/Pressure: Present     Pelvic:  Cervical exam deferred        Extremities: Normal range of motion.  Edema: None  Mental Status: Normal mood and affect. Normal behavior. Normal judgment and thought content.   Assessment and Plan:  Pregnancy: G1P0 at 6065w6d  1. Encounter for supervision of normal first pregnancy in third trimester   Term labor symptoms and general obstetric precautions including but not limited to vaginal bleeding, contractions, leaking of fluid and fetal movement were reviewed in detail with the patient. Please refer to After Visit Summary for other counseling recommendations.    Return in about 1 week (around 04/13/2016) for ROB.   Dorathy KinsmanVirginia Haroldine Redler, CNM

## 2016-04-12 ENCOUNTER — Other Ambulatory Visit: Payer: Self-pay | Admitting: Advanced Practice Midwife

## 2016-04-13 ENCOUNTER — Ambulatory Visit (INDEPENDENT_AMBULATORY_CARE_PROVIDER_SITE_OTHER): Payer: Managed Care, Other (non HMO) | Admitting: Advanced Practice Midwife

## 2016-04-13 VITALS — BP 121/64 | HR 97 | Wt 163.0 lb

## 2016-04-13 DIAGNOSIS — Z3403 Encounter for supervision of normal first pregnancy, third trimester: Secondary | ICD-10-CM

## 2016-04-13 NOTE — Progress Notes (Signed)
    PRENATAL VISIT NOTE  Subjective:  Amanda Simmons is a 21 y.o. G1P0 at 7042w4d being seen today for ongoing prenatal care.  She is currently monitored for the following issues for this low-risk pregnancy and has Seborrhea-like dermatitis with psoriasiform elements; Attention deficit hyperactivity disorder (ADHD); Nausea without vomiting; Constipation; Depression; Supervision of normal first pregnancy; and Bereavement on her problem list.  Patient reports occasional contractions.  Contractions: Irregular. Vag. Bleeding: None.  Movement: Present. Denies leaking of fluid.   The following portions of the patient's history were reviewed and updated as appropriate: allergies, current medications, past family history, past medical history, past social history, past surgical history and problem list. Problem list updated.  Objective:   Vitals:   04/13/16 1105  BP: 121/64  Pulse: 97  Weight: 163 lb (73.9 kg)    Fetal Status: Fetal Heart Rate (bpm): 140 Fundal Height: 40 cm Movement: Present  Presentation: Vertex  General:  Alert, oriented and cooperative. Patient is in no acute distress.  Skin: Skin is warm and dry. No rash noted.   Cardiovascular: Normal heart rate noted  Respiratory: Normal respiratory effort, no problems with respiration noted  Abdomen: Soft, gravid, appropriate for gestational age. Pain/Pressure: Present     Pelvic:  Cervical exam performed Dilation: 1.5 Effacement (%): 50 Station: -3  Extremities: Normal range of motion.  Edema: None  Mental Status: Normal mood and affect. Normal behavior. Normal judgment and thought content.   Assessment and Plan:  Pregnancy: G1P0 at 5642w4d  There are no diagnoses linked to this encounter. Term labor symptoms and general obstetric precautions including but not limited to vaginal bleeding, contractions, leaking of fluid and fetal movement were reviewed in detail with the patient. Please refer to After Visit Summary for other  counseling recommendations.  ROB, NST 1 week   [place] Filicia Scogin, [place]

## 2016-04-13 NOTE — Patient Instructions (Signed)
Labor Induction Labor induction is when steps are taken to cause a pregnant woman to begin the labor process. Most women go into labor on their own between 37 weeks and 42 weeks of the pregnancy. When this does not happen or when there is a medical need, methods may be used to induce labor. Labor induction causes a pregnant woman's uterus to contract. It also causes the cervix to soften (ripen), open (dilate), and thin out (efface). Usually, labor is not induced before 39 weeks of the pregnancy unless there is a problem with the baby or mother. Before inducing labor, your health care provider will consider a number of factors, including the following:  The medical condition of you and the baby.  How many weeks along you are.  The status of the baby's lung maturity.  The condition of the cervix.  The position of the baby. What are the reasons for labor induction? Labor may be induced for the following reasons:  The health of the baby or mother is at risk.  The pregnancy is overdue by 1 week or more.  The water breaks but labor does not start on its own.  The mother has a health condition or serious illness, such as high blood pressure, infection, placental abruption, or diabetes.  The amniotic fluid amounts are low around the baby.  The baby is distressed. Convenience or wanting the baby to be born on a certain date is not a reason for inducing labor. What methods are used for labor induction? Several methods of labor induction may be used, such as:  Prostaglandin medicine. This medicine causes the cervix to dilate and ripen. The medicine will also start contractions. It can be taken by mouth or by inserting a suppository into the vagina.  Inserting a thin tube (catheter) with a balloon on the end into the vagina to dilate the cervix. Once inserted, the balloon is expanded with water, which causes the cervix to open.  Stripping the membranes. Your health care provider separates  amniotic sac tissue from the cervix, causing the cervix to be stretched and causing the release of a hormone called progesterone. This may cause the uterus to contract. It is often done during an office visit. You will be sent home to wait for the contractions to begin. You will then come in for an induction.  Breaking the water. Your health care provider makes a hole in the amniotic sac using a small instrument. Once the amniotic sac breaks, contractions should begin. This may still take hours to see an effect.  Medicine to trigger or strengthen contractions. This medicine is given through an IV access tube inserted into a vein in your arm. All of the methods of induction, besides stripping the membranes, will be done in the hospital. Induction is done in the hospital so that you and the baby can be carefully monitored. How long does it take for labor to be induced? Some inductions can take up to 2-3 days. Depending on the cervix, it usually takes less time. It takes longer when you are induced early in the pregnancy or if this is your first pregnancy. If a mother is still pregnant and the induction has been going on for 2-3 days, either the mother will be sent home or a cesarean delivery will be needed. What are the risks associated with labor induction? Some of the risks of induction include:  Changes in fetal heart rate, such as too high, too low, or erratic.  Fetal distress.    Chance of infection for the mother and baby.  Increased chance of having a cesarean delivery.  Breaking off (abruption) of the placenta from the uterus (rare).  Uterine rupture (very rare). When induction is needed for medical reasons, the benefits of induction may outweigh the risks. What are some reasons for not inducing labor? Labor induction should not be done if:  It is shown that your baby does not tolerate labor.  You have had previous surgeries on your uterus, such as a myomectomy or the removal of  fibroids.  Your placenta lies very low in the uterus and blocks the opening of the cervix (placenta previa).  Your baby is not in a head-down position.  The umbilical cord drops down into the birth canal in front of the baby. This could cut off the baby's blood and oxygen supply.  You have had a previous cesarean delivery.  There are unusual circumstances, such as the baby being extremely premature. This information is not intended to replace advice given to you by your health care provider. Make sure you discuss any questions you have with your health care provider. Document Released: 06/13/2006 Document Revised: 06/30/2015 Document Reviewed: 08/21/2012 Elsevier Interactive Patient Education  2017 Else[employer] Inc.  

## 2016-04-14 ENCOUNTER — Inpatient Hospital Stay (HOSPITAL_COMMUNITY): Payer: Managed Care, Other (non HMO) | Admitting: Anesthesiology

## 2016-04-14 ENCOUNTER — Encounter (HOSPITAL_COMMUNITY): Payer: Self-pay

## 2016-04-14 ENCOUNTER — Inpatient Hospital Stay (HOSPITAL_COMMUNITY)
Admission: AD | Admit: 2016-04-14 | Discharge: 2016-04-16 | DRG: 775 | Disposition: A | Discharge status: Home / Self Care | Payer: Managed Care, Other (non HMO) | Source: Ambulatory Visit | Attending: Obstetrics & Gynecology | Admitting: Obstetrics & Gynecology

## 2016-04-14 ENCOUNTER — Inpatient Hospital Stay (HOSPITAL_COMMUNITY)
Admission: AD | Admit: 2016-04-14 | Discharge: 2016-04-14 | Disposition: A | Discharge status: Home / Self Care | Payer: Managed Care, Other (non HMO) | Source: Ambulatory Visit | Attending: Obstetrics & Gynecology | Admitting: Obstetrics & Gynecology

## 2016-04-14 DIAGNOSIS — Z3A39 39 weeks gestation of pregnancy: Secondary | ICD-10-CM

## 2016-04-14 DIAGNOSIS — Z880 Allergy status to penicillin: Secondary | ICD-10-CM | POA: Diagnosis not present

## 2016-04-14 DIAGNOSIS — O479 False labor, unspecified: Secondary | ICD-10-CM

## 2016-04-14 DIAGNOSIS — O4202 Full-term premature rupture of membranes, onset of labor within 24 hours of rupture: Secondary | ICD-10-CM | POA: Diagnosis present

## 2016-04-14 DIAGNOSIS — Z3403 Encounter for supervision of normal first pregnancy, third trimester: Secondary | ICD-10-CM

## 2016-04-14 HISTORY — DX: Anxiety disorder, unspecified: F41.9

## 2016-04-14 LAB — TYPE AND SCREEN
ABO/RH(D): O POS
Antibody Screen: NEGATIVE

## 2016-04-14 LAB — CBC
HCT: 27.3 % — ABNORMAL LOW (ref 36.0–46.0)
Hemoglobin: 8.6 g/dL — ABNORMAL LOW (ref 12.0–15.0)
MCH: 23 pg — ABNORMAL LOW (ref 26.0–34.0)
MCHC: 31.5 g/dL (ref 30.0–36.0)
MCV: 73 fL — AB (ref 78.0–100.0)
PLATELETS: 243 10*3/uL (ref 150–400)
RBC: 3.74 MIL/uL — AB (ref 3.87–5.11)
RDW: 16.2 % — ABNORMAL HIGH (ref 11.5–15.5)
WBC: 10.6 10*3/uL — AB (ref 4.0–10.5)

## 2016-04-14 LAB — ABO/RH: ABO/RH(D): O POS

## 2016-04-14 MED ORDER — DIBUCAINE 1 % RE OINT: 1.0000 "application " | TOPICAL_OINTMENT | RECTAL | Status: DC | PRN

## 2016-04-14 MED ORDER — TETANUS-DIPHTH-ACELL PERTUSSIS 5-2.5-18.5 LF-MCG/0.5 IM SUSP: 0.5000 mL | Freq: Once | INTRAMUSCULAR | Status: DC

## 2016-04-14 MED ORDER — OXYCODONE-ACETAMINOPHEN 5-325 MG PO TABS
2.0000 | ORAL_TABLET | ORAL | Status: DC | PRN
Start: 2016-04-14 — End: 2016-04-14

## 2016-04-14 MED ORDER — OXYTOCIN 40 UNITS IN LACTATED RINGERS INFUSION - SIMPLE MED
2.5000 [IU]/h | INTRAVENOUS | Status: DC
  Administered 2016-04-14: 2.5 [IU]/h via INTRAVENOUS
  Filled 2016-04-14: qty 1000

## 2016-04-14 MED ORDER — LACTATED RINGERS IV SOLN
500.0000 mL | Freq: Once | INTRAVENOUS | Status: AC
  Administered 2016-04-14: 500 mL via INTRAVENOUS

## 2016-04-14 MED ORDER — OXYTOCIN 40 UNITS IN LACTATED RINGERS INFUSION - SIMPLE MED
1.0000 m[IU]/min | INTRAVENOUS | Status: DC
  Administered 2016-04-14: 2 m[IU]/min via INTRAVENOUS

## 2016-04-14 MED ORDER — ACETAMINOPHEN 325 MG PO TABS: 650.0000 mg | ORAL_TABLET | ORAL | Status: DC | PRN

## 2016-04-14 MED ORDER — DIPHENHYDRAMINE HCL 50 MG/ML IJ SOLN: 12.5000 mg | INTRAMUSCULAR | Status: DC | PRN

## 2016-04-14 MED ORDER — ONDANSETRON HCL 4 MG/2ML IJ SOLN: 4.0000 mg | INTRAMUSCULAR | Status: DC | PRN

## 2016-04-14 MED ORDER — LIDOCAINE HCL (PF) 1 % IJ SOLN
INTRAMUSCULAR | Status: DC | PRN
  Administered 2016-04-14: 5 mL via EPIDURAL
  Administered 2016-04-14: 7 mL via EPIDURAL

## 2016-04-14 MED ORDER — SIMETHICONE 80 MG PO CHEW: 80.0000 mg | CHEWABLE_TABLET | ORAL | Status: DC | PRN

## 2016-04-14 MED ORDER — ONDANSETRON HCL 4 MG/2ML IJ SOLN
4.0000 mg | Freq: Four times a day (QID) | INTRAMUSCULAR | Status: DC | PRN
  Filled 2016-04-14: qty 2

## 2016-04-14 MED ORDER — FENTANYL CITRATE (PF) 100 MCG/2ML IJ SOLN: 100.0000 ug | INTRAMUSCULAR | Status: DC | PRN

## 2016-04-14 MED ORDER — OXYTOCIN BOLUS FROM INFUSION
500.0000 mL | Freq: Once | INTRAVENOUS | Status: AC
  Administered 2016-04-14: 500 mL via INTRAVENOUS

## 2016-04-14 MED ORDER — ONDANSETRON HCL 4 MG PO TABS: 4.0000 mg | ORAL_TABLET | ORAL | Status: DC | PRN

## 2016-04-14 MED ORDER — ZOLPIDEM TARTRATE 5 MG PO TABS: 5.0000 mg | ORAL_TABLET | Freq: Every evening | ORAL | Status: DC | PRN

## 2016-04-14 MED ORDER — BENZOCAINE-MENTHOL 20-0.5 % EX AERO
1.0000 "application " | INHALATION_SPRAY | CUTANEOUS | Status: DC | PRN
  Filled 2016-04-14: qty 56

## 2016-04-14 MED ORDER — FLEET ENEMA 7-19 GM/118ML RE ENEM: 1.0000 | ENEMA | RECTAL | Status: DC | PRN

## 2016-04-14 MED ORDER — LACTATED RINGERS IV SOLN: 500.0000 mL | INTRAVENOUS | Status: DC | PRN

## 2016-04-14 MED ORDER — LACTATED RINGERS IV SOLN
INTRAVENOUS | Status: DC
  Administered 2016-04-14 (×2): via INTRAVENOUS

## 2016-04-14 MED ORDER — DIPHENHYDRAMINE HCL 25 MG PO CAPS: 25.0000 mg | ORAL_CAPSULE | Freq: Four times a day (QID) | ORAL | Status: DC | PRN

## 2016-04-14 MED ORDER — TERBUTALINE SULFATE 1 MG/ML IJ SOLN
0.2500 mg | Freq: Once | INTRAMUSCULAR | Status: DC | PRN
  Filled 2016-04-14: qty 1

## 2016-04-14 MED ORDER — PRENATAL MULTIVITAMIN CH
1.0000 | ORAL_TABLET | Freq: Every day | ORAL | Status: DC
  Administered 2016-04-15: 1 via ORAL
  Filled 2016-04-14: qty 1

## 2016-04-14 MED ORDER — SOD CITRATE-CITRIC ACID 500-334 MG/5ML PO SOLN: 30.0000 mL | ORAL | Status: DC | PRN

## 2016-04-14 MED ORDER — COCONUT OIL OIL: 1.0000 "application " | TOPICAL_OIL | Status: DC | PRN

## 2016-04-14 MED ORDER — IBUPROFEN 600 MG PO TABS
600.0000 mg | ORAL_TABLET | Freq: Four times a day (QID) | ORAL | Status: DC
  Administered 2016-04-14 – 2016-04-16 (×6): 600 mg via ORAL
  Filled 2016-04-14 (×6): qty 1

## 2016-04-14 MED ORDER — FENTANYL 2.5 MCG/ML BUPIVACAINE 1/10 % EPIDURAL INFUSION (WH - ANES)
14.0000 mL/h | INTRAMUSCULAR | Status: DC | PRN
  Administered 2016-04-14: 14 mL/h via EPIDURAL
  Filled 2016-04-14: qty 100

## 2016-04-14 MED ORDER — PHENYLEPHRINE 40 MCG/ML (10ML) SYRINGE FOR IV PUSH (FOR BLOOD PRESSURE SUPPORT)
80.0000 ug | PREFILLED_SYRINGE | INTRAVENOUS | Status: DC | PRN
  Filled 2016-04-14: qty 5

## 2016-04-14 MED ORDER — EPHEDRINE 5 MG/ML INJ
10.0000 mg | INTRAVENOUS | Status: DC | PRN
  Filled 2016-04-14: qty 2

## 2016-04-14 MED ORDER — OXYCODONE-ACETAMINOPHEN 5-325 MG PO TABS: 1.0000 | ORAL_TABLET | ORAL | Status: DC | PRN

## 2016-04-14 MED ORDER — LIDOCAINE HCL (PF) 1 % IJ SOLN
30.0000 mL | INTRAMUSCULAR | Status: DC | PRN
  Administered 2016-04-14: 30 mL via SUBCUTANEOUS
  Filled 2016-04-14: qty 30

## 2016-04-14 MED ORDER — SENNOSIDES-DOCUSATE SODIUM 8.6-50 MG PO TABS
2.0000 | ORAL_TABLET | ORAL | Status: DC
  Administered 2016-04-15 (×2): 2 via ORAL
  Filled 2016-04-14 (×2): qty 2

## 2016-04-14 MED ORDER — WITCH HAZEL-GLYCERIN EX PADS: 1.0000 "application " | MEDICATED_PAD | CUTANEOUS | Status: DC | PRN

## 2016-04-14 MED ORDER — PHENYLEPHRINE 40 MCG/ML (10ML) SYRINGE FOR IV PUSH (FOR BLOOD PRESSURE SUPPORT)
80.0000 ug | PREFILLED_SYRINGE | INTRAVENOUS | Status: DC | PRN
  Filled 2016-04-14: qty 10
  Filled 2016-04-14: qty 5

## 2016-04-14 NOTE — MAU Note (Signed)
Pt in restroom when called

## 2016-04-14 NOTE — H&P (Signed)
LABOR & DELIVERY - HISTORY AND PHYSICAL  HPI- Amanda Simmons is a 21 y.o. female presenting for SOL. Started feeling more regular contractions at 0600 and noticed some mild vaginal bleeding with mucus, what prompted her to come to the hospital. Denies fevers, headaches, RUQ pain, leg swelling, dizzines, N/V, blurred vision, SOB or chest pain. No problems during pregnancy, no health issues before. Had mouth surgery in the past.   Does not take any medications other than prenatal vitamins.  Now feeling pain with regular contractions. SROM at [facility]U, about one hour ago. OB History    Gravida Para Term Preterm AB Living   1             SAB TAB Ectopic Multiple Live Births                 Past Medical History:  Diagnosis Date  . ADD (attention deficit disorder)   . Anxiety    Past Surgical History:  Procedure Laterality Date  . MOUTH SURGERY     Family History: family history is not on file. Social History:  reports that she has never smoked. She has quit using smokeless tobacco. She reports that she does not drink alcohol or use drugs.    Clinic  [facility] Prenatal Labs  Dating  LMP c/w u/s Blood type: O/POS/-- (08/02 1048)   Genetic Screen  Declines Antibody:NEG (08/02 1048)  Anatomic US  Nml @ 19 wks, limited>f/u as indicated Rubella: 3.58 (08/02 1048)  GTT  Third trimester: 122 RPR: NON REAC (08/02 1048)   Flu vaccine  Declined HBsAg: NEGATIVE (08/02 1048)   TDaP vaccine  Declined                                            HIV: NONREACTIVE (08/02 1048)   Baby Food  Breast                                          GBS: Neg (For PCN allergy, check sensitivities)    Neg  Contraception Undecided Pap: n/a  Circumcision  n/a female   Pediatrician  Amanda Simmons - [facility]   Support Person  Amanda Simmons ([facility])         Maternal Diabetes: No Genetic Screening: declined Maternal Ultrasounds/Referrals: Normal Fetal Ultrasounds or other Referrals:  None Maternal Substance Abuse:   No Significant Maternal Medications:  None Significant Maternal Lab Results:  None Other Comments:  None  Review of Systems  Constitutional: Negative for fever.  Eyes: Negative for blurred vision and double vision.  Respiratory: Negative for shortness of breath.   Cardiovascular: Negative for chest pain and leg swelling.  Gastrointestinal: Negative for abdominal pain, nausea and vomiting.  Genitourinary: Negative for dysuria.  Neurological: Negative for dizziness and headaches.   Maternal Medical History:  Reason for admission: Nausea.    Dilation: 5 Effacement (%): 90 Station: -1 Exam by:: Amanda Simmons,rnc Blood pressure 113/68, pulse (!) 106, temperature 98.6 F (37 C), resp. rate 18, height 5\' 7"  (1.702 m), weight 163 lb (73.9 kg), last menstrual period 07/16/2015, SpO2 100 %. Exam Physical Exam  Constitutional: She is oriented to person, place, and time. She appears well-developed and well-nourished.  HENT:  Head: Normocephalic and atraumatic.  Eyes: Conjunctivae and EOM are normal.  Pupils are equal, round, and reactive to light.  Cardiovascular: Intact distal pulses.   Respiratory: Effort normal. No respiratory distress.  GI: Soft. She exhibits no distension. There is no tenderness. There is no rebound and no guarding.  gravid  Neurological: She is alert and oriented to person, place, and time.  Skin: Skin is warm and dry. No rash noted. No pallor.    Prenatal labs: ABO, Rh: --/--/O POS (03/10 1420) Antibody: NEG (03/10 1420) Rubella: 3.58 (08/02 1048) RPR: NON REAC (12/15 0825)  HBsAg: NEGATIVE (08/02 1048)  HIV: NONREACTIVE (12/15 0825)  GBS: Negative (02/16 0000)   Assessment/Plan:  SOL: SOL, SROM when she stood up to leave MAU, light mec Pain: IV pain meds prn; epidural on request FWB: Cat I MOC: Unsure MOF: Breast CIRC: N/A - female  Amanda Simmons 04/14/2016, 3:43 PM   OB FELLOW HISTORY AND PHYSICAL ATTESTATION  I have seen and examined  this patient; I agree with above documentation in the resident's note.    Amanda Mow, DO [place] Fellow 04/14/2016

## 2016-04-14 NOTE — MAU Note (Signed)
Noted mucous kind of grayish d/c with blood in it. Was examined yesterday. 1+cm.   No water leaking. Having some contractions. <5 min.

## 2016-04-14 NOTE — Discharge Instructions (Signed)
f Fetal Movement Counts Patient Name: ________________________________________________ Patient Due Date: ____________________ What is a fetal movement count? A fetal movement count is the number of times that you feel your baby move during a certain amount of time. This may also be called a fetal kick count. A fetal movement count is recommended for every pregnant woman. You may be asked to start counting fetal movements as early as week 28 of your pregnancy. Pay attention to when your baby is most active. You may notice your baby's sleep and wake cycles. You may also notice things that make your baby move more. You should do a fetal movement count:  When your baby is normally most active.  At the same time each day. A good time to count movements is while you are resting, after having something to eat and drink. How do I count fetal movements? 1. Find a quiet, comfortable area. Sit, or lie down on your side. 2. Write down the date, the start time and stop time, and the number of movements that you felt between those two times. Take this information with you to your health care visits. 3. For 2 hours, count kicks, flutters, swishes, rolls, and jabs. You should feel at least 10 movements during 2 hours. 4. You may stop counting after you have felt 10 movements. 5. If you do not feel 10 movements in 2 hours, have something to eat and drink. Then, keep resting and counting for 1 hour. If you feel at least 4 movements during that hour, you may stop counting. Contact a health care provider if:  You feel fewer than 4 movements in 2 hours.  Your baby is not moving like he or she usually does. Date: ____________ Start time: ____________ Stop time: ____________ Movements: ____________ Date: ____________ Start time: ____________ Stop time: ____________ Movements: ____________ Date: ____________ Start time: ____________ Stop time: ____________ Movements: ____________ Date: ____________ Start time:  ____________ Stop time: ____________ Movements: ____________ Date: ____________ Start time: ____________ Stop time: ____________ Movements: ____________ Date: ____________ Start time: ____________ Stop time: ____________ Movements: ____________ Date: ____________ Start time: ____________ Stop time: ____________ Movements: ____________ Date: ____________ Start time: ____________ Stop time: ____________ Movements: ____________ Date: ____________ Start time: ____________ Stop time: ____________ Movements: ____________ This information is not intended to replace advice given to you by your health care provider. Make sure you discuss any questions you have with your health care provider. Document Released: 02/21/2006 Document Revised: 09/21/2015 Document Reviewed: 03/03/2015 Elsevier Interactive Patient Education  2017 [employer] Vaginal Delivery Vaginal delivery means that you will give birth by pushing your baby out of your birth canal (vagina). A team of health care providers will help you before, during, and after vaginal delivery. Birth experiences are unique for every woman and every pregnancy, and birth experiences vary depending on where you choose to give birth. What should I do to prepare for my baby's birth? Before your baby is born, it is important to talk with your health care provider about:  Your labor and delivery preferences. These may include:  Medicines that you may be given.  How you will manage your pain. This might include non-medical pain relief techniques or injectable pain relief such as epidural analgesia.  How you and your baby will be monitored during labor and delivery.  Who may be in the labor and delivery room with you.  Your feelings about surgical delivery of your baby (cesarean delivery, or C-section) if this becomes necessary.  Your feelings about receiving donated  blood through an IV tube (blood transfusion) if this becomes necessary.  Whether you are  able:  To take pictures or videos of the birth.  To eat during labor and delivery.  To move around, walk, or change positions during labor and delivery.  What to expect after your baby is born, such as:  Whether delayed umbilical cord clamping and cutting is offered.  Who will care for your baby right after birth.  Medicines or tests that may be recommended for your baby.  Whether breastfeeding is supported in your hospital or birth center.  How long you will be in the hospital or birth center.  How any medical conditions you have may affect your baby or your labor and delivery experience. To prepare for your baby's birth, you should also:  Attend all of your health care visits before delivery (prenatal visits) as recommended by your health care provider. This is important.  Prepare your home for your baby's arrival. Make sure that you have:  Diapers.  Baby clothing.  Feeding equipment.  Safe sleeping arrangements for you and your baby.  Install a car seat in your vehicle. Have your car seat checked by a certified car seat installer to make sure that it is installed safely.  Think about who will help you with your new baby at home for at least the first several weeks after delivery. What can I expect when I arrive at the birth center or hospital? Once you are in labor and have been admitted into the hospital or birth center, your health care provider may:  Review your pregnancy history and any concerns you have.  Insert an IV tube into one of your veins. This is used to give you fluids and medicines.  Check your blood pressure, pulse, temperature, and heart rate (vital signs).  Check whether your bag of water (amniotic sac) has broken (ruptured).  Talk with you about your birth plan and discuss pain control options. Monitoring  Your health care provider may monitor your contractions (uterine monitoring) and your baby's heart rate (fetal monitoring). You may need to  be monitored:  Often, but not continuously (intermittently).  All the time or for long periods at a time (continuously). Continuous monitoring may be needed if:  You are taking certain medicines, such as medicine to relieve pain or make your contractions stronger.  You have pregnancy or labor complications. Monitoring may be done by:  Placing a special stethoscope or a handheld monitoring device on your abdomen to check your baby's heartbeat, and feeling your abdomen for contractions. This method of monitoring does not continuously record your baby's heartbeat or your contractions.  Placing monitors on your abdomen (external monitors) to record your baby's heartbeat and the frequency and length of contractions. You may not have to wear external monitors all the time.  Placing monitors inside of your uterus (internal monitors) to record your baby's heartbeat and the frequency, length, and strength of your contractions.  Your health care provider may use internal monitors if he or she needs more information about the strength of your contractions or your baby's heart rate.  Internal monitors are put in place by passing a thin, flexible wire through your vagina and into your uterus. Depending on the type of monitor, it may remain in your uterus or on your baby's head until birth.  Your health care provider will discuss the benefits and risks of internal monitoring with you and will ask for your permission before inserting the monitors.  Telemetry. This is a type of continuous monitoring that can be done with external or internal monitors. Instead of having to stay in bed, you are able to move around during telemetry. Ask your health care provider if telemetry is an option for you. Physical exam  Your health care provider may perform a physical exam. This may include:  Checking whether your baby is positioned:  With the head toward your vagina (head-down). This is most common.  With the  head toward the top of your uterus (head-up or breech). If your baby is in a breech position, your health care provider may try to turn your baby to a head-down position so you can deliver vaginally. If it does not seem that your baby can be born vaginally, your provider may recommend surgery to deliver your baby. In rare cases, you may be able to deliver vaginally if your baby is head-up (breech delivery).  Lying sideways (transverse). Babies that are lying sideways cannot be delivered vaginally.  Checking your cervix to determine:  Whether it is thinning out (effacing).  Whether it is opening up (dilating).  How low your baby has moved into your birth canal. What are the three stages of labor and delivery?   Normal labor and delivery is divided into the following three stages: Stage 1   Stage 1 is the longest stage of labor, and it can last for hours or days. Stage 1 includes:  Early labor. This is when contractions may be irregular, or regular and mild. Generally, early labor contractions are more than 10 minutes apart.  Active labor. This is when contractions get longer, more regular, more frequent, and more intense.  The transition phase. This is when contractions happen very close together, are very intense, and may last longer than during any other part of labor.  Contractions generally feel mild, infrequent, and irregular at first. They get stronger, more frequent (about every 2-3 minutes), and more regular as you progress from early labor through active labor and transition.  Many women progress through stage 1 naturally, but you may need help to continue making progress. If this happens, your health care provider may talk with you about:  Rupturing your amniotic sac if it has not ruptured yet.  Giving you medicine to help make your contractions stronger and more frequent.  Stage 1 ends when your cervix is completely dilated to 4 inches (10 cm) and completely effaced. This  happens at the end of the transition phase. Stage 2   Once your cervix is completely effaced and dilated to 4 inches (10 cm), you may start to feel an urge to push. It is common for the body to naturally take a rest before feeling the urge to push, especially if you received an epidural or certain other pain medicines. This rest period may last for up to 1-2 hours, depending on your unique labor experience.  During stage 2, contractions are generally less painful, because pushing helps relieve contraction pain. Instead of contraction pain, you may feel stretching and burning pain, especially when the widest part of your baby's head passes through the vaginal opening (crowning).  Your health care provider will closely monitor your pushing progress and your baby's progress through the vagina during stage 2.  Your health care provider may massage the area of skin between your vaginal opening and anus (perineum) or apply warm compresses to your perineum. This helps it stretch as the baby's head starts to crown, which can help prevent perineal tearing.  In some cases, an incision may be made in your perineum (episiotomy) to allow the baby to pass through the vaginal opening. An episiotomy helps to make the opening of the vagina larger to allow more room for the baby to fit through.  It is very important to breathe and focus so your health care provider can control the delivery of your baby's head. Your health care provider may have you decrease the intensity of your pushing, to help prevent perineal tearing.  After delivery of your baby's head, the shoulders and the rest of the body generally deliver very quickly and without difficulty.  Once your baby is delivered, the umbilical cord may be cut right away, or this may be delayed for 1-2 minutes, depending on your baby's health. This may vary among health care providers, hospitals, and birth centers.  If you and your baby are healthy enough, your baby  may be placed on your chest or abdomen to help maintain the baby's temperature and to help you bond with each other. Some mothers and babies start breastfeeding at this time. Your health care team will dry your baby and help keep your baby warm during this time.  Your baby may need immediate care if he or she:  Showed signs of distress during labor.  Has a medical condition.  Was born too early (prematurely).  Had a bowel movement before birth (meconium).  Shows signs of difficulty transitioning from being inside the uterus to being outside of the uterus. If you are planning to breastfeed, your health care team will help you begin a feeding. Stage 3   The third stage of labor starts immediately after the birth of your baby and ends after you deliver the placenta. The placenta is an organ that develops during pregnancy to provide oxygen and nutrients to your baby in the womb.  Delivering the placenta may require some pushing, and you may have mild contractions. Breastfeeding can stimulate contractions to help you deliver the placenta.  After the placenta is delivered, your uterus should tighten (contract) and become firm. This helps to stop bleeding in your uterus. To help your uterus contract and to control bleeding, your health care provider may:  Give you medicine by injection, through an IV tube, by mouth, or through your rectum (rectally).  Massage your abdomen or perform a vaginal exam to remove any blood clots that are left in your uterus.  Empty your bladder by placing a thin, flexible tube (catheter) into your bladder.  Encourage you to breastfeed your baby. After labor is over, you and your baby will be monitored closely to ensure that you are both healthy until you are ready to go home. Your health care team will teach you how to care for yourself and your baby. This information is not intended to replace advice given to you by your health care provider. Make sure you discuss  any questions you have with your health care provider. Document Released: 11/01/2007 Document Revised: 08/12/2015 Document Reviewed: 02/06/2015 Elsevier Interactive Patient Education  2017 [employer].

## 2016-04-14 NOTE — Lactation Note (Signed)
This note was copied from a baby's chart. Lactation Consultation Note  Patient Name: Amanda Simmons ZOXWR'UToday's Date: 04/14/2016 Reason for consult: Initial assessment Baby at 1 hr of life. Mom has short nipples with soft breast. Baby can latch when the breast it "tea cupped" but baby was very sleepy and irritable at this visit. Mom denies breast or nipple pain. She voiced no concerns. Discussed baby behavior, feeding frequency, baby belly size, voids, wt loss, breast changes, and nipple care. Demonstrated manual expression, colostrum noted bilaterally. Given lactation handouts. Aware of OP services and support group.    Maternal Data Has patient been taught Hand Expression?: Yes Does the patient have breastfeeding experience prior to this delivery?: No  Feeding Feeding Type: Breast Fed Length of feed: 10 min  LATCH Score/Interventions Latch: Repeated attempts needed to sustain latch, nipple held in mouth throughout feeding, stimulation needed to elicit sucking reflex. Intervention(s): Adjust position;Assist with latch  Audible Swallowing: None Intervention(s): Skin to skin;Hand expression  Type of Nipple: Everted at rest and after stimulation  Comfort (Breast/Nipple): Soft / non-tender     Hold (Positioning): Full assist, staff holds infant at breast Intervention(s): Position options  LATCH Score: 5  Lactation Tools Discussed/Used WIC Program: Yes   Consult Status Consult Status: Follow-up Date: 04/15/16 Follow-up type: In-patient    [name] 04/14/2016, 9:39 PM

## 2016-04-14 NOTE — MAU Note (Signed)
Contractions 4-6 min, hurts to sit.  Hip is really hurting. Pinkish d/c now.

## 2016-04-14 NOTE — Anesthesia Preprocedure Evaluation (Signed)
Anesthesia Evaluation  Patient identified by MRN, date of birth, ID band Patient awake    Reviewed: Allergy & Precautions, H&P , NPO status , Patient's Chart, lab work & pertinent test results  Airway Mallampati: I  TM Distance: >3 FB Neck ROM: full    Dental no notable dental hx.    Pulmonary neg pulmonary ROS,    Pulmonary exam normal        Cardiovascular negative cardio ROS Normal cardiovascular exam     Neuro/Psych negative neurological ROS     GI/Hepatic negative GI ROS, Neg liver ROS,   Endo/Other  negative endocrine ROS  Renal/GU negative Renal ROS     Musculoskeletal   Abdominal Normal abdominal exam  (+)   Peds  Hematology negative hematology ROS (+)   Anesthesia Other Findings   Reproductive/Obstetrics (+) Pregnancy                             Anesthesia Physical Anesthesia Plan  ASA: II  Anesthesia Plan: Epidural   Post-op Pain Management:    Induction:   Airway Management Planned:   Additional Equipment:   Intra-op Plan:   Post-operative Plan:   Informed Consent: I have reviewed the patients History and Physical, chart, labs and discussed the procedure including the risks, benefits and alternatives for the proposed anesthesia with the patient or authorized representative who has indicated his/her understanding and acceptance.     Plan Discussed with:   Anesthesia Plan Comments:         Anesthesia Quick Evaluation  

## 2016-04-14 NOTE — Anesthesia Procedure Notes (Signed)
Epidural Patient location during procedure: OB Start time: 04/14/2016 3:26 PM End time: 04/14/2016 3:28 PM  Staffing Anesthesiologist: Leilani AbleHATCHETT, Pamelia Botto Performed: anesthesiologist   Preanesthetic Checklist Completed: patient identified, surgical consent, pre-op evaluation, timeout performed, IV checked, risks and benefits discussed and monitors and equipment checked  Epidural Patient position: sitting Prep: site prepped and draped and DuraPrep Patient monitoring: continuous pulse ox and blood pressure Approach: midline Location: L3-L4 Injection technique: LOR air  Needle:  Needle type: Tuohy  Needle gauge: 17 G Needle length: 9 cm and 9 Needle insertion depth: 6 cm Catheter type: closed end flexible Catheter size: 19 Gauge Catheter at skin depth: 11 cm Test dose: negative and Other  Assessment Sensory level: T9 Events: blood not aspirated, injection not painful, no injection resistance, negative IV test and no paresthesia  Additional Notes Reason for block:procedure for pain

## 2016-04-14 NOTE — Progress Notes (Signed)
I have communicated with Venia Carbon NP and reviewed vital signs:  Vitals:   04/14/16 0810  BP: 124/74  Pulse: 99  Resp: 16  Temp: 98.3 F (36.8 C)    Vaginal exam:  Dilation: 1.5 Effacement (%): 60 Cervical Position: Posterior Station: -2 Presentation: Vertex Exam by:: Laurell Josephs RN,   Also reviewed contraction pattern and that non-stress test is reactive.  It has been documented that patient is contracting every 4-6 minutes with no cervical change since yesterday not indicating active labor.  Patient denies any other complaints.  Based on this report provider has given order for discharge.  A discharge order and diagnosis entered by a provider.   Labor discharge instructions reviewed with patient.

## 2016-04-15 LAB — CBC
HCT: 24.4 % — ABNORMAL LOW (ref 36.0–46.0)
Hemoglobin: 7.6 g/dL — ABNORMAL LOW (ref 12.0–15.0)
MCH: 22.8 pg — AB (ref 26.0–34.0)
MCHC: 31.1 g/dL (ref 30.0–36.0)
MCV: 73.1 fL — ABNORMAL LOW (ref 78.0–100.0)
PLATELETS: 213 10*3/uL (ref 150–400)
RBC: 3.34 MIL/uL — AB (ref 3.87–5.11)
RDW: 16.3 % — AB (ref 11.5–15.5)
WBC: 13.3 10*3/uL — ABNORMAL HIGH (ref 4.0–10.5)

## 2016-04-15 LAB — RPR: RPR Ser Ql: NONREACTIVE

## 2016-04-15 NOTE — Anesthesia Postprocedure Evaluation (Signed)
Anesthesia Post Note  Patient: Amanda Simmons  Procedure(s) Performed: * No procedures listed *  Patient location during evaluation: Mother Baby Anesthesia Type: Epidural Level of consciousness: awake, awake and alert, oriented and patient cooperative Pain management: pain level controlled Vital Signs Assessment: post-procedure vital signs reviewed and stable Respiratory status: spontaneous breathing, nonlabored ventilation and respiratory function stable Cardiovascular status: stable Postop Assessment: no headache, no backache, patient able to bend at knees and no signs of nausea or vomiting Anesthetic complications: no        Last Vitals:  Vitals:   04/15/16 0315 04/15/16 0715  BP: (!) 105/55 110/66  Pulse: 87 73  Resp: 18 18  Temp: 36.8 C 36.6 C    Last Pain:  Vitals:   04/15/16 0516  TempSrc:   PainSc: 5    Pain Goal: Patients Stated Pain Goal: 4 (04/14/16 1450)               Georgetta Crafton L

## 2016-04-15 NOTE — Lactation Note (Signed)
This note was copied from a baby's chart. Lactation Consultation Note  Patient Name: Amanda Simmons YNWGN'FToday's Date: 04/15/2016 Reason for consult: Follow-up assessment;Difficult latch RN requested LC assist Mom with latch. Baby fussy and Mom getting frustrated. RN not able to get baby latched at this visit. Mom has flat nipples, has been using hand pump to help with latch. LC assisted Mom with positioning and using breast compression to latch. Baby demonstrated some good suckling bursts but Mom not able to latch independently and baby very fussy with latch. Baby is noted to have short lingual frenulum and is humping her tongue causing some difficulty organizing her suck. High palate. Initiated 20 nipple shield and after few attempts baby was able to latch, demonstrating good suckling bursts. Colostrum present in nipple shield. Mom then applied nipple shield to right breast and was able to latch baby without difficulty. Set up DEBP for Mom to post pump after feedings for 15 minutes to encourage milk production. Basic teaching reviewed, advised baby should be at breast 8-12 times in 24 hours nursing for 15-30 minutes both breasts some feeding, cluster feeding reviewed. Advised to look for colostrum in nipple shield with feedings. Advised Mom if baby nursing frequently just BF at night, pump this evening and resume pumping tomorrow to allow Mom to get some rest. Mom to call for assist as needed.   Maternal Data    Feeding Feeding Type: Breast Fed Length of feed: 25 min  LATCH Score/Interventions Latch: Repeated attempts needed to sustain latch, nipple held in mouth throughout feeding, stimulation needed to elicit sucking reflex. Intervention(s): Adjust position  Audible Swallowing: Spontaneous and intermittent  Type of Nipple: Flat Intervention(s): Hand pump  Comfort (Breast/Nipple): Soft / non-tender     Hold (Positioning): Assistance needed to correctly position infant at breast and  maintain latch. Intervention(s): Breastfeeding basics reviewed;Support Pillows;Position options;Skin to skin  LATCH Score: 7  Lactation Tools Discussed/Used Tools: Nipple [name];Pump Nipple shield size: 20 Breast pump type: Double-Electric Breast Pump Pump Review: Setup, frequency, and cleaning Initiated by:: KG Date initiated:: 04/15/16   Consult Status Consult Status: Follow-up Date: 04/16/16 Follow-up type: In-patient    [name], [name] 04/15/2016, 5:17 PM

## 2016-04-15 NOTE — Progress Notes (Signed)
POSTPARTUM PROGRESS NOTE  Post Partum Day 1  Subjective:  Amanda Simmons is a 21 y.o. G1P1001 681w5d s/p SVD.  No acute events overnight.  Pt denies problems with ambulating, voiding or po intake.  She denies nausea or vomiting.  Pain is well controlled.  She has had flatus. She has not had bowel movement.  Lochia Minimal.   Objective: Blood pressure 110/66, pulse 73, temperature 97.9 F (36.6 C), resp. rate 18, height 5\' 7"  (1.702 m), weight 163 lb (73.9 kg), last menstrual period 07/16/2015, SpO2 100 %, unknown if currently breastfeeding.  Physical Exam:  General: alert, cooperative and no distress Lochia:normal flow Chest: no respiratory distress Heart:regular rate, distal pulses intact Abdomen: soft, nontender,  Uterine Fundus: firm, appropriately tender DVT Evaluation: No calf swelling or tenderness Extremities: no edema   Recent Labs  04/14/16 1420 04/15/16 0513  HGB 8.6* 7.6*  HCT 27.3* 24.4*    Assessment/Plan:  ASSESSMENT: Amanda Simmons is a 21 y.o. G1P1001 5681w5d s/p svd  Plan for discharge tomorrow   LOS: 1 day   Nehemiah Settlena Carvalho do AmaralMD 04/15/2016, 7:49 AM

## 2016-04-15 NOTE — Progress Notes (Signed)
CSW met with MOB at bedside to complete assessment for consult regarding hx of anxiety. Upon this writers arrival, MOB had several visitors; however, noted it was ok to proceed. When prompted about hx of anxiety, MOB notes she briefly experienced it once during her pregnancy due to not having a car at that moment and being stuck in the house. MOB noted once that was resolved she was fine. MOB denies being on med's noting she does not need them. MOB's family in the room was also in agreement of this. MOB denies any further needs noting she is doing ok.   Referral is screened out by Clinical Social Worker because none of the following criteria appear to apply and there are no reports impacting the pregnancy or her transition to the postpartum period.  CSW does not deem it clinically necessary to further investigate at this time.   - History of anxiety during this pregnancy, or of post-partum depression. (anxiety not applicable to pregnancy)  - Diagnosis of anxiety within last 3 years - History of depression due to pregnancy loss/loss of child -MOB's symptoms are currently being treated with medication and/or therapy.  Please contact the Clinical Social Worker if needs arise or upon MOB request.    Oda Cogan, MSW, [facility]  Office: [phone]

## 2016-04-15 NOTE — Progress Notes (Signed)
Patient ID: Amanda Simmons, female   DOB: 09/22/1995, 21 y.o.   MRN: [id number] I have reviewed the chart, seen the patient and agree with the resident's assessment.

## 2016-04-16 MED ORDER — IBUPROFEN 600 MG PO TABS: 600.0000 mg | ORAL_TABLET | Freq: Four times a day (QID) | ORAL | 0 refills | Status: DC

## 2016-04-16 MED ORDER — NORETHINDRONE 0.35 MG PO TABS: 1.0000 | ORAL_TABLET | Freq: Every day | ORAL | 11 refills | Status: DC

## 2016-04-16 NOTE — Discharge Instructions (Signed)

## 2016-04-16 NOTE — Discharge Summary (Signed)
Obstetric Discharge Summary Reason for Admission: [redacted] weeks EGA SOL Prenatal Procedures: ultrasound Intrapartum Procedures: spontaneous vaginal delivery Postpartum Procedures: none Complications-Operative and Postpartum: 1st degree perineal laceration Hemoglobin  Date Value Ref Range Status  04/15/2016 7.6 (L) 12.0 - 15.0 g/dL Final   HCT  Date Value Ref Range Status  04/15/2016 24.4 (L) 36.0 - 46.0 % Final    Physical Exam:  General: alert Lochia: appropriate Uterine Fundus: firm and NT at U-1 DVT Evaluation: No evidence of DVT seen on physical exam.  Discharge Diagnoses: Term Pregnancy-delivered  Discharge Information: Date: 04/16/2016 Activity: pelvic rest Diet: routine Medications: PNV, Ibuprofen and micronor Condition: stable Instructions: refer to practice specific booklet Discharge to: home Follow-up Information    [facility] [place]. Schedule an appointment as soon as possible for a visit in 6 week(s).   Specialty:  Obstetrics and Gynecology Contact information: [address], [address] [place] [id number] [phone]          Newborn Data: Live born female  Birth Weight: 8 lb 10.8 oz (3935 g) APGAR: 8, 9  Home with mother.  Allie BossierMyra C Rahman Ferrall 04/16/2016, 6:57 AM

## 2016-04-16 NOTE — Lactation Note (Signed)
This note was copied from a baby's chart. Lactation Consultation Note: Mom has baby latched to breast as I went into room. Continues using NS and reports it is helping a lot.  Encouraged mom to keep baby close to the breast - she is letting her slide to the tip of the nipple. Whitish milk noted in NS when baby came off the breast.  Mom reports breasts are feeling heavier this morning. Latched baby easily to other breast after correctly applying NS. Reviewed engorgement prevention and treatment.  [facility] No questions at present. Reviewed our phone number, OP appointments and BFSG as resources for support after DC. Is going to see [facility] and may see their LC there.,Still nursing as I left room.   Patient Name: Amanda Rod MaeBreana Simmons ZOXWR'UToday's Date: 04/16/2016 Reason for consult: Follow-up assessment   Maternal Data Formula Feeding for Exclusion: No Has patient been taught Hand Expression?: Yes Does the patient have breastfeeding experience prior to this delivery?: No  Feeding Feeding Type: Breast Fed  LATCH Score/Interventions                      Lactation Tools Discussed/Used Tools: Nipple [name];Pump Nipple shield size: 20 Breast pump type: Double-Electric Breast Pump   Consult Status Consult Status: Complete    Pamelia HoitWeeks, Mindie Rawdon D 04/16/2016, 9:54 AM

## 2016-04-20 ENCOUNTER — Encounter: Payer: Self-pay | Admitting: Family

## 2016-05-28 ENCOUNTER — Encounter: Payer: Self-pay | Admitting: Advanced Practice Midwife

## 2016-05-28 ENCOUNTER — Ambulatory Visit (INDEPENDENT_AMBULATORY_CARE_PROVIDER_SITE_OTHER): Payer: Managed Care, Other (non HMO) | Admitting: Advanced Practice Midwife

## 2016-05-28 VITALS — BP 118/71 | HR 79 | Ht 66.0 in | Wt 135.0 lb

## 2016-05-28 DIAGNOSIS — Z30011 Encounter for initial prescription of contraceptive pills: Secondary | ICD-10-CM

## 2016-05-28 MED ORDER — NORGESTIMATE-ETH ESTRADIOL 0.25-35 MG-MCG PO TABS: 1.0000 | ORAL_TABLET | Freq: Every day | ORAL | 12 refills | Status: DC

## 2016-05-28 NOTE — Progress Notes (Signed)
Post Partum Exam  Amanda Simmons is a 21 y.o. G83P1001 female who presents for a postpartum visit. She is 6 weeks postpartum following a spontaneous vaginal delivery. I have fully reviewed the prenatal and intrapartum course. The delivery was at 39 gestational weeks.  Anesthesia: epidural. Postpartum course has been unremarkable. Baby's course has been unremarkable. Baby is feeding by bottle- Similac Advance. Bleeding thin lochia. Bowel function is normal. Bladder function is normal. Patient is not sexually active. Contraception method is OCP (estrogen/progesterone). Postpartum depression screening:neg (score 5)   The following portions of the patient's history were reviewed and updated as appropriate: allergies, current medications, past family history, past medical history, past social history, past surgical history and problem list.  Review of Systems Pertinent items are noted in HPI.    Pap not due do to age <63  Objective:  BP 118/71   Pulse 79   Ht  (1.676 m)   Wt 135 lb (61.2 kg)   Breastfeeding? No   BMI 21.79 kg/m   General:  alert, cooperative, appears stated age and no distress   Breasts:  Declined  Lungs: clear to auscultation bilaterally  Heart:  regular rate and rhythm, S1, S2 normal, no murmur, click, rub or gallop  Neck No Thyromegaly  Abdomen: soft, non-tender; bowel sounds normal; no masses,  no organomegaly   Vulva:  not evaluated  Vagina: not evaluated           Rectal Exam: Not performed.        Assessment:    Nml postpartum exam. Pap smear not done at today's visit.   Plan:   1. Contraception: OCP (estrogen/progesterone) 2. Discussed LARC. 3. Follow up in: 1 year or as needed.

## 2016-05-28 NOTE — Patient Instructions (Signed)
Contraception Choices Contraception (birth control) is the use of any methods or devices to prevent pregnancy. Below are some methods to help avoid pregnancy. Hormonal methods  Contraceptive implant. This is a thin, plastic tube containing progesterone hormone. It does not contain estrogen hormone. Your health care provider inserts the tube in the inner part of the upper arm. The tube can remain in place for up to 3 years. After 3 years, the implant must be removed. The implant prevents the ovaries from releasing an egg (ovulation), thickens the cervical mucus to prevent sperm from entering the uterus, and thins the lining of the inside of the uterus.  Progesterone-only injections. These injections are given every 3 months by your health care provider to prevent pregnancy. This synthetic progesterone hormone stops the ovaries from releasing eggs. It also thickens cervical mucus and changes the uterine lining. This makes it harder for sperm to survive in the uterus.  Birth control pills. These pills contain estrogen and progesterone hormone. They work by preventing the ovaries from releasing eggs (ovulation). They also cause the cervical mucus to thicken, preventing the sperm from entering the uterus. Birth control pills are prescribed by a health care provider.Birth control pills can also be used to treat heavy periods.  Minipill. This type of birth control pill contains only the progesterone hormone. They are taken every day of each month and must be prescribed by your health care provider.  Birth control patch. The patch contains hormones similar to those in birth control pills. It must be changed once a week and is prescribed by a health care provider.  Vaginal ring. The ring contains hormones similar to those in birth control pills. It is left in the vagina for 3 weeks, removed for 1 week, and then a new one is put back in place. The patient must be comfortable inserting and removing the ring from  the vagina.A health care provider's prescription is necessary.  Emergency contraception. Emergency contraceptives prevent pregnancy after unprotected sexual intercourse. This pill can be taken right after sex or up to 5 days after unprotected sex. It is most effective the sooner you take the pills after having sexual intercourse. Most emergency contraceptive pills are available without a prescription. Check with your pharmacist. Do not use emergency contraception as your only form of birth control. Barrier methods  Female condom. This is a thin sheath (latex or rubber) that is worn over the penis during sexual intercourse. It can be used with spermicide to increase effectiveness.  Female condom. This is a soft, loose-fitting sheath that is put into the vagina before sexual intercourse.  Diaphragm. This is a soft, latex, dome-shaped barrier that must be fitted by a health care provider. It is inserted into the vagina, along with a spermicidal jelly. It is inserted before intercourse. The diaphragm should be left in the vagina for 6 to 8 hours after intercourse.  Cervical cap. This is a round, soft, latex or plastic cup that fits over the cervix and must be fitted by a health care provider. The cap can be left in place for up to 48 hours after intercourse.  Sponge. This is a soft, circular piece of polyurethane foam. The sponge has spermicide in it. It is inserted into the vagina after wetting it and before sexual intercourse.  Spermicides. These are chemicals that kill or block sperm from entering the cervix and uterus. They come in the form of creams, jellies, suppositories, foam, or tablets. They do not require a prescription. They   are inserted into the vagina with an applicator before having sexual intercourse. The process must be repeated every time you have sexual intercourse. Intrauterine contraception  Intrauterine device (IUD). This is a T-shaped device that is put in a woman's uterus during  a menstrual period to prevent pregnancy. There are 2 types: ? Copper IUD. This type of IUD is wrapped in copper wire and is placed inside the uterus. Copper makes the uterus and fallopian tubes produce a fluid that kills sperm. It can stay in place for 10 years. ? Hormone IUD. This type of IUD contains the hormone progestin (synthetic progesterone). The hormone thickens the cervical mucus and prevents sperm from entering the uterus, and it also thins the uterine lining to prevent implantation of a fertilized egg. The hormone can weaken or kill the sperm that get into the uterus. It can stay in place for 3-5 years, depending on which type of IUD is used. Permanent methods of contraception  Female tubal ligation. This is when the woman's fallopian tubes are surgically sealed, tied, or blocked to prevent the egg from traveling to the uterus.  Hysteroscopic sterilization. This involves placing a small coil or insert into each fallopian tube. Your doctor uses a technique called hysteroscopy to do the procedure. The device causes scar tissue to form. This results in permanent blockage of the fallopian tubes, so the sperm cannot fertilize the egg. It takes about 3 months after the procedure for the tubes to become blocked. You must use another form of birth control for these 3 months.  Female sterilization. This is when the female has the tubes that carry sperm tied off (vasectomy).This blocks sperm from entering the vagina during sexual intercourse. After the procedure, the man can still ejaculate fluid (semen). Natural planning methods  Natural family planning. This is not having sexual intercourse or using a barrier method (condom, diaphragm, cervical cap) on days the woman could become pregnant.  Calendar method. This is keeping track of the length of each menstrual cycle and identifying when you are fertile.  Ovulation method. This is avoiding sexual intercourse during ovulation.  Symptothermal method.  This is avoiding sexual intercourse during ovulation, using a thermometer and ovulation symptoms.  Post-ovulation method. This is timing sexual intercourse after you have ovulated. Regardless of which type or method of contraception you choose, it is important that you use condoms to protect against the transmission of sexually transmitted infections (STIs). Talk with your health care provider about which form of contraception is most appropriate for you. This information is not intended to replace advice given to you by your health care provider. Make sure you discuss any questions you have with your health care provider. Document Released: 01/22/2005 Document Revised: 06/30/2015 Document Reviewed: 07/17/2012 Elsevier Interactive Patient Education  2017 Elsevier Inc.  

## 2016-09-06 ENCOUNTER — Ambulatory Visit (INDEPENDENT_AMBULATORY_CARE_PROVIDER_SITE_OTHER): Payer: Managed Care, Other (non HMO) | Admitting: Obstetrics & Gynecology

## 2016-09-06 ENCOUNTER — Encounter: Payer: Self-pay | Admitting: Obstetrics & Gynecology

## 2016-09-06 ENCOUNTER — Ambulatory Visit (INDEPENDENT_AMBULATORY_CARE_PROVIDER_SITE_OTHER): Payer: Managed Care, Other (non HMO) | Admitting: Sports Medicine

## 2016-09-06 ENCOUNTER — Encounter: Payer: Self-pay | Admitting: Sports Medicine

## 2016-09-06 VITALS — BP 132/74 | HR 92 | Resp 16 | Ht 66.0 in | Wt 134.0 lb

## 2016-09-06 DIAGNOSIS — Z3009 Encounter for other general counseling and advice on contraception: Secondary | ICD-10-CM

## 2016-09-06 DIAGNOSIS — L0291 Cutaneous abscess, unspecified: Secondary | ICD-10-CM

## 2016-09-06 MED ORDER — DOXYCYCLINE HYCLATE 100 MG PO TABS: 100.0000 mg | ORAL_TABLET | Freq: Two times a day (BID) | ORAL | 0 refills | Status: AC

## 2016-09-06 MED ORDER — HYDROCODONE-ACETAMINOPHEN 5-325 MG PO TABS: 1.0000 | ORAL_TABLET | Freq: Three times a day (TID) | ORAL | 0 refills | Status: DC | PRN

## 2016-09-06 MED ORDER — LEVONORGESTREL 0.75 MG PO TABS: 0.7500 mg | ORAL_TABLET | Freq: Two times a day (BID) | ORAL | Status: AC

## 2016-09-06 NOTE — Progress Notes (Signed)
  Subjective:    CC: pimple with rash around it  HPI: Patient reports that she has had a pimple with a rash around it. She first noticed the pimple about 3 days ago. Patient reports pimple is very sensitive to touch, even her shirt rubbing against it reproduces pain. Patient has not tried anything to make it better or worse. Patient drew a circle around rash last night, and erythema has since progressed past its borders. No new contacts to the area including: soaps, clothes, or plants.   Past medical history:  Negative.  See flowsheet/record as well for more information.  Surgical history: Negative.  See flowsheet/record as well for more information.  Family history: Negative.  See flowsheet/record as well for more information.  Social history: Negative.  See flowsheet/record as well for more information.  Allergies, and medications have been entered into the medical record, reviewed, and no changes needed.   Review of Systems: No fevers, chills, night sweats, weight loss, chest pain, or shortness of breath.   Objective:    General: Well Developed, well nourished, and in no acute distress.  Neuro: Alert and oriented x3, extra-ocular muscles intact, sensation grossly intact.  HEENT: Normocephalic, atraumatic, pupils equal round reactive to light, neck supple, no masses, no lymphadenopathy, thyroid nonpalpable.  Skin: Warm and dry. Pustule present with surrounding erythema below umbilicus, abscess present under overlying tissue. Cardiac: Regular rate and rhythm, no murmurs rubs or gallops, no lower extremity edema.  Respiratory: Clear to auscultation bilaterally. Not using accessory muscles, speaking in full sentences.   Impression and Recommendations:    21 year-old female with pimple and surrounding rash. Given the time course of presentation and physical exam findings, this is most likely a folliculitis that subsequently became a skin abscess. Incision and drainage performed in clinic.  Wound was packed with iodinated gauze and patient was instructed to remove this on Saturday. Patient was given a prescription for doxycycline and instructed to follow-up in clinic if infection returns or does not improve.

## 2016-09-06 NOTE — Patient Instructions (Signed)
Remove 1 inch of packing Saturday morning, and the rest of the packing Sunday morning.  Incision and Drainage, Care After Refer to this sheet in the next few weeks. These instructions provide you with information about caring for yourself after your procedure. Your health care provider may also give you more specific instructions. Your treatment has been planned according to current medical practices, but problems sometimes occur. Call your health care provider if you have any problems or questions after your procedure. What can I expect after the procedure? After the procedure, it is common to have:  Pain or discomfort around your incision site.  Drainage from your incision.  Follow these instructions at home:  Take over-the-counter and prescription medicines only as told by your health care provider.  If you were prescribed an antibiotic medicine, take it as told by your health care provider.Do not stop taking the antibiotic even if you start to feel better.  Followinstructions from your health care provider about: ? How to take care of your incision. ? When and how you should change your packing and bandage (dressing). Wash your hands with soap and water before you change your dressing. If soap and water are not available, use hand sanitizer. ? When you should remove your dressing.  Do not take baths, swim, or use a hot tub until your health care provider approves.  Keep all follow-up visits as told by your health care provider. This is important.  Check your incision area every day for signs of infection. Check for: ? More redness, swelling, or pain. ? More fluid or blood. ? Warmth. ? Pus or a bad smell. Contact a health care provider if:  Your cyst or abscess returns.  You have a fever.  You have more redness, swelling, or pain around your incision.  You have more fluid or blood coming from your incision.  Your incision feels warm to the touch.  You have pus or a bad  smell coming from your incision. Get help right away if:  You have severe pain or bleeding.  You cannot eat or drink without vomiting.  You have decreased urine output.  You become short of breath.  You have chest pain.  You cough up blood.  The area where the incision and drainage occurred becomes numb or it tingles. This information is not intended to replace advice given to you by your health care provider. Make sure you discuss any questions you have with your health care provider. Document Released: 04/16/2011 Document Revised: 06/24/2015 Document Reviewed: 11/12/2014 Elsevier Interactive Patient Education  2017 [employer]

## 2016-09-06 NOTE — Assessment & Plan Note (Signed)
Incision and drainage with about 2 inches of packing. Doxycycline, hydrocodone. Return to see me in one week.

## 2016-09-06 NOTE — Progress Notes (Signed)
   Subjective:    Patient ID: Amanda Simmons, female    DOB: 12/09/1995, 21 y.o.   MRN: [id number]  HPI  21 yo female G1P1001 presents for counseling and change or birth control.  OCPs made her nauseated.  Pt interested in Mirena.  Pt has had unprotected intercourse these past two weeks.  (coitus interruptus).  Review of Systems  Constitutional: Negative.   Respiratory: Negative.   Cardiovascular: Negative.   Gastrointestinal: Negative.   Genitourinary: Negative for menstrual problem.  Psychiatric/Behavioral: Positive for dysphoric mood.       Objective:   Physical Exam  Constitutional: She is oriented to person, place, and time. She appears well-developed and well-nourished. No distress.  HENT:  Head: Normocephalic and atraumatic.  Eyes: Conjunctivae are normal.  Cardiovascular: Normal rate.   Pulmonary/Chest: Effort normal.  Neurological: She is alert and oriented to person, place, and time.  Skin: Skin is warm and dry.  Psychiatric: She has a normal mood and affect.  Vitals reviewed.  Vitals:   09/06/16 1403  BP: 132/74  Pulse: 92  Resp: 16  Weight: 134 lb (60.8 kg)  Height: 5\' 6"  (1.676 m)      Assessment & Plan:  21 yo female who wants IUD.    Wait 2 weeks after unprotected intercourse Cultures on day on insertion F/U with PCP for depression screening and treatment.  NO HI/SI today. Plan B presecibed.  15 mins spent face to face with patient with >50% counseling.

## 2016-09-07 MED ORDER — ONDANSETRON 8 MG PO TBDP: 8.0000 mg | ORAL_TABLET | Freq: Three times a day (TID) | ORAL | 3 refills | Status: DC | PRN

## 2016-09-07 NOTE — Addendum Note (Signed)
Addended by: Monica BectonHEKKEKANDAM, Larue Lightner J on: 09/07/2016 08:52 AM   Modules accepted: Orders

## 2016-09-12 ENCOUNTER — Telehealth: Payer: Self-pay | Admitting: Family Medicine

## 2016-09-12 NOTE — Telephone Encounter (Signed)
Spoke to patient gave her information as noted below. She stated that she will call back to schedule. Zakayla Martinec,CMA

## 2016-09-12 NOTE — Telephone Encounter (Signed)
Pt was seen by OBGYN who advised to follow up with me.  Please contact the patient and arrange for an appointment with me to follow up mood.

## 2016-09-13 ENCOUNTER — Ambulatory Visit (INDEPENDENT_AMBULATORY_CARE_PROVIDER_SITE_OTHER): Payer: Managed Care, Other (non HMO) | Admitting: Sports Medicine

## 2016-09-13 ENCOUNTER — Encounter: Payer: Self-pay | Admitting: Sports Medicine

## 2016-09-13 DIAGNOSIS — L0291 Cutaneous abscess, unspecified: Secondary | ICD-10-CM

## 2016-09-13 NOTE — Progress Notes (Signed)
  Subjective: 1 week post I&D of a periumbilical abscess, doing well.   Objective: General: Well-developed, well-nourished, and in no acute distress. Wound: Incision is clean and dry, mostly intact. No further surrounding erythema, there is a palpable subcutaneous nodule. Only minimally tender.  Assessment/plan:   Abscess Doing extremely well, I like to see her back in 1 more month to reevaluate this nodule, she will continue to massage the area.

## 2016-09-13 NOTE — Assessment & Plan Note (Signed)
Doing extremely well, I like to see her back in 1 more month to reevaluate this nodule, she will continue to massage the area.

## 2016-09-20 ENCOUNTER — Encounter: Payer: Self-pay | Admitting: Obstetrics & Gynecology

## 2016-09-20 ENCOUNTER — Ambulatory Visit (INDEPENDENT_AMBULATORY_CARE_PROVIDER_SITE_OTHER): Payer: Managed Care, Other (non HMO) | Admitting: Obstetrics & Gynecology

## 2016-09-20 ENCOUNTER — Encounter: Payer: Self-pay | Admitting: *Deleted

## 2016-09-20 VITALS — BP 130/74 | HR 82 | Resp 16 | Ht 66.0 in | Wt 133.0 lb

## 2016-09-20 DIAGNOSIS — Z3202 Encounter for pregnancy test, result negative: Secondary | ICD-10-CM | POA: Diagnosis not present

## 2016-09-20 DIAGNOSIS — Z30430 Encounter for insertion of intrauterine contraceptive device: Secondary | ICD-10-CM

## 2016-09-20 LAB — POCT URINE PREGNANCY: Preg Test, Ur: NEGATIVE

## 2016-09-20 MED ORDER — LEVONORGESTREL 20 MCG/24HR IU IUD
INTRAUTERINE_SYSTEM | Freq: Once | INTRAUTERINE | Status: AC
  Administered 2016-09-20: 15:00:00 via INTRAUTERINE

## 2016-09-20 NOTE — Progress Notes (Signed)
   Subjective:    Patient ID: Amanda Simmons, female    DOB: 09/05/1995, 21 y.o.   MRN: [id number]  HPI 21 yo S W P411 (365 month old daughter) here for Mirena insertion. She has been abstinent for 2 weeks.   Review of Systems     Objective:   Physical Exam Well nourished, well hydrated white female, no apparent distress Breathing, conversing, and ambulating normally UPT negative, consent signed, Time out procedure done. Cervix prepped with betadine and grasped with a single tooth tenaculum. Mirena was easily placed and the strings were cut to 3-4 cm. Uterus sounded to 9 cm. She tolerated the procedure well.     Assessment & Plan:  Contraception- Mirena Rec back up method for 2 weeks RTC 4 weeks for string check

## 2016-10-11 ENCOUNTER — Encounter: Payer: Self-pay | Admitting: Family Medicine

## 2016-10-11 ENCOUNTER — Ambulatory Visit (INDEPENDENT_AMBULATORY_CARE_PROVIDER_SITE_OTHER): Payer: Managed Care, Other (non HMO) | Admitting: Family Medicine

## 2016-10-11 VITALS — BP 116/75 | HR 102 | Wt 135.0 lb

## 2016-10-11 DIAGNOSIS — F321 Major depressive disorder, single episode, moderate: Secondary | ICD-10-CM

## 2016-10-11 MED ORDER — SERTRALINE HCL 25 MG PO TABS: ORAL_TABLET | ORAL | 1 refills | Status: DC

## 2016-10-11 NOTE — Progress Notes (Signed)
Amanda SchneidersBreana N Simmons is a 21 y.o. female who [facility] today for mood.   Amanda NamBrenna is about 5 months post partum from her first child. She is the primary caregiver. She notes worsening mood recently. She feels lack of motivation and energy to do things and difficulty sleeping. She is concerned she may have depression. She feels isolated and she has taking care of the child mostly by herself. She does not participate in any mother's groups. She denies any SI or HI denies any prior history of depression. She has a history of ADHD. She has done some counseling in the past for bereavement which she found to be minimally helpful.   Past Medical History:  Diagnosis Date  . ADD (attention deficit disorder)   . Anxiety    Past Surgical History:  Procedure Laterality Date  . MOUTH SURGERY     Social History  Substance Use Topics  . Smoking status: Never Smoker  . Smokeless tobacco: Former NeurosurgeonUser  . Alcohol use No   family history is not on file.  ROS as above:  Medications: Current Outpatient Prescriptions  Medication Sig Dispense Refill  . sertraline (ZOLOFT) 25 MG tablet Take 1 pill po daily for 1 week then increase to 2 pills by mouth daily. Recheck in 2 weeks 30 tablet 1   No current facility-administered medications for this visit.    No Known Allergies  Health Maintenance Health Maintenance  Topic Date Due  . TETANUS/TDAP  04/08/2015  . INFLUENZA VACCINE  09/05/2016  . CHLAMYDIA SCREENING  02/28/2017  . HIV Screening  Completed     Exam:  BP 116/75   Pulse (!) 102   Wt 135 lb (61.2 kg)   BMI 21.79 kg/m  Gen: Well NAD HEENT: EOMI,  MMM No goiter Lungs: Normal work of breathing. CTABL Heart: RRR no MRG heart rate 80 beats per minute and per my check Abd: NABS, Soft. Nondistended, Nontender Exts: Brisk capillary refill, warm and well  perfused.  Psych alert and oriented normal speech thought process and affect. No SI or HI expressed.  Depression screen PHQ 2/9 10/11/2016  Decreased Interest 1  Down, Depressed, Hopeless 1  PHQ - 2 Score 2  Altered sleeping 1  Tired, decreased energy 1  Change in appetite 1  Feeling bad or failure about yourself  0  Trouble concentrating 1  Moving slowly or fidgety/restless 0  Suicidal thoughts 0  PHQ-9 Score 6  Difficult doing work/chores Somewhat difficult  Some encounter information is confidential and restricted. Go to Review Flowsheets activity to see all data.   GAD 7 : Generalized Anxiety Score 10/11/2016  Nervous, Anxious, on Edge 0  Control/stop worrying 0  Worry too much - different things 1  Trouble relaxing 0  Restless 0  Easily annoyed or irritable 1  Afraid - awful might happen 0  Total GAD 7 Score 2  Anxiety Difficulty Somewhat difficult       No results found for this or any previous visit (from the past 72 hour(s)). No results found.    Assessment and Plan: 21 y.o. female with  Depression: Symptoms consistent with major depression. After long discussion plan to start treatment with Zoloft. Plan to start at 25 mg and titrate to 100 mg. Recheck in 2 weeks. Patient declines referral to counseling/therapy at this time.   No orders of the defined types were placed in this encounter.  Meds  ordered this encounter  Medications  . sertraline (ZOLOFT) 25 MG tablet    Sig: Take 1 pill po daily for 1 week then increase to 2 pills by mouth daily. Recheck in 2 weeks    Dispense:  30 tablet    Refill:  1     Discussed warning signs or symptoms. Please see discharge instructions. Patient expresses understanding.  I spent 25 minutes with this patient, greater than 50% was face-to-face time counseling regarding differential diagnosis treatment plan and options.Marland Kitchen

## 2016-10-11 NOTE — Patient Instructions (Signed)
Thank you for coming in today. Start zoloft daily.  Most people it makes a bit sleepy so take it in the evening. It if wakes you up take it in the morning.  Increase to  (2 pills) daily after 1 week.  Recheck with me in 2 weeks.  Return sooner if needed.   I also recommend trying to join a mother's group.   Sertraline tablets What is this medicine? SERTRALINE (SER tra leen) is used to treat depression. It may also be used to treat obsessive compulsive disorder, panic disorder, post-trauma stress, premenstrual dysphoric disorder (PMDD) or social anxiety. This medicine may be used for other purposes; ask your health care provider or pharmacist if you have questions. COMMON BRAND NAME(S): Zoloft What should I tell my health care provider before I take this medicine? They need to know if you have any of these conditions: -bleeding disorders -bipolar disorder or a family history of bipolar disorder -glaucoma -heart disease -high blood pressure -history of irregular heartbeat -history of low levels of calcium, magnesium, or potassium in the blood -if you often drink alcohol -liver disease -receiving electroconvulsive therapy -seizures -suicidal thoughts, plans, or attempt; a previous suicide attempt by you or a family member -take medicines that treat or prevent blood clots -thyroid disease -an unusual or allergic reaction to sertraline, other medicines, foods, dyes, or preservatives -pregnant or trying to get pregnant -breast-feeding How should I use this medicine? Take this medicine by mouth with a glass of water. Follow the directions on the prescription label. You can take it with or without food. Take your medicine at regular intervals. Do not take your medicine more often than directed. Do not stop taking this medicine suddenly except upon the advice of your doctor. Stopping this medicine too quickly may cause serious side effects or your condition may worsen. A special  MedGuide will be given to you by the pharmacist with each prescription and refill. Be sure to read this information carefully each time. Talk to your pediatrician regarding the use of this medicine in children. While this drug may be prescribed for children as young as 7 years for selected conditions, precautions do apply. Overdosage: If you think you have taken too much of this medicine contact a poison control center or emergency room at once. NOTE: This medicine is only for you. Do not share this medicine with others. What if I miss a dose? If you miss a dose, take it as soon as you can. If it is almost time for your next dose, take only that dose. Do not take double or extra doses. What may interact with this medicine? Do not take this medicine with any of the following medications: -cisapride -dofetilide -dronedarone -linezolid -MAOIs like Carbex, Eldepryl, Marplan, Nardil, and Parnate -methylene blue (injected into a vein) -pimozide -thioridazine This medicine may also interact with the following medications: -alcohol -amphetamines -aspirin and aspirin-like medicines -certain medicines for depression, anxiety, or psychotic disturbances -certain medicines for fungal infections like ketoconazole, fluconazole, posaconazole, and itraconazole -certain medicines for irregular heart beat like flecainide, quinidine, propafenone -certain medicines for migraine headaches like almotriptan, eletriptan, frovatriptan, naratriptan, rizatriptan, sumatriptan, zolmitriptan -certain medicines for sleep -certain medicines for seizures like carbamazepine, valproic acid, phenytoin -certain medicines that treat or prevent blood clots like warfarin, enoxaparin, dalteparin -cimetidine -digoxin -diuretics -fentanyl -isoniazid -lithium -NSAIDs, medicines for pain and inflammation, like ibuprofen or naproxen -other medicines that prolong the QT interval (cause an abnormal heart  rhythm) -rasagiline -safinamide -supplements like St. John's  wort, kava kava, valerian -tolbutamide -tramadol -tryptophan This list may not describe all possible interactions. Give your health care provider a list of all the medicines, herbs, non-prescription drugs, or dietary supplements you use. Also tell them if you smoke, drink alcohol, or use illegal drugs. Some items may interact with your medicine. What should I watch for while using this medicine? Tell your doctor if your symptoms do not get better or if they get worse. Visit your doctor or health care professional for regular checks on your progress. Because it may take several weeks to see the full effects of this medicine, it is important to continue your treatment as prescribed by your doctor. Patients and their families should watch out for new or worsening thoughts of suicide or depression. Also watch out for sudden changes in feelings such as feeling anxious, agitated, panicky, irritable, hostile, aggressive, impulsive, severely restless, overly excited and hyperactive, or not being able to sleep. If this happens, especially at the beginning of treatment or after a change in dose, call your health care professional. Bonita Quin may get drowsy or dizzy. Do not drive, use machinery, or do anything that needs mental alertness until you know how this medicine affects you. Do not stand or sit up quickly, especially if you are an older patient. This reduces the risk of dizzy or fainting spells. Alcohol may interfere with the effect of this medicine. Avoid alcoholic drinks. Your mouth may get dry. Chewing sugarless gum or sucking hard candy, and drinking plenty of water may help. Contact your doctor if the problem does not go away or is severe. What side effects may I notice from receiving this medicine? Side effects that you should report to your doctor or health care professional as soon as possible: -allergic reactions like skin rash, itching or  hives, swelling of the face, lips, or tongue -anxious -black, tarry stools -changes in vision -confusion -elevated mood, decreased need for sleep, racing thoughts, impulsive behavior -eye pain -fast, irregular heartbeat -feeling faint or lightheaded, falls -feeling agitated, angry, or irritable -hallucination, loss of contact with reality -loss of balance or coordination -loss of memory -painful or prolonged erections -restlessness, pacing, inability to keep still -seizures -stiff muscles -suicidal thoughts or other mood changes -trouble sleeping -unusual bleeding or bruising -unusually weak or tired -vomiting Side effects that usually do not require medical attention (report to your doctor or health care professional if they continue or are bothersome): -change in appetite or weight -change in sex drive or performance -diarrhea -increased sweating -indigestion, nausea -tremors This list may not describe all possible side effects. Call your doctor for medical advice about side effects. You may report side effects to FDA at 1-800-FDA-1088. Where should I keep my medicine? Keep out of the reach of children. Store at room temperature between 15 and 30 degrees C (59 and 86 degrees F). Throw away any unused medicine after the expiration date. NOTE: This sheet is a summary. It may not cover all possible information. If you have questions about this medicine, talk to your doctor, pharmacist, or health care provider.  2018 Elsevier/Gold Standard (2016-01-27 14:17:49)

## 2016-10-25 ENCOUNTER — Ambulatory Visit: Payer: Self-pay | Admitting: Family Medicine

## 2016-10-25 DIAGNOSIS — Z0189 Encounter for other specified special examinations: Secondary | ICD-10-CM

## 2016-11-07 ENCOUNTER — Ambulatory Visit: Payer: Self-pay | Admitting: Obstetrics & Gynecology

## 2016-11-07 DIAGNOSIS — Z30431 Encounter for routine checking of intrauterine contraceptive device: Secondary | ICD-10-CM

## 2017-03-21 ENCOUNTER — Telehealth: Payer: Self-pay | Admitting: Family Medicine

## 2017-03-21 MED ORDER — OSELTAMIVIR PHOSPHATE 75 MG PO CAPS: 75.0000 mg | ORAL_CAPSULE | Freq: Two times a day (BID) | ORAL | 0 refills | Status: DC

## 2017-03-21 NOTE — Telephone Encounter (Signed)
Tamiflu sent to pharmacy.  Follow up with me soon for depression.

## 2017-03-21 NOTE — Telephone Encounter (Signed)
Patients daughter diagnosed with flu yesterday afternoon. Patient reports having low grade fever, headache and runny nose. [facility] [facility] [place].   Patient also stated her depression medication was not working, so she has stopped it. Transferred patient to make appointment with PCP.

## 2017-03-21 NOTE — Telephone Encounter (Signed)
Patient notified about Dr. Zollie Peeorey's recommendations to take the Tamiflu and she did not have any questions. She will follow up with him on 03/28/2017 for her Depression.

## 2017-03-28 ENCOUNTER — Encounter: Payer: Self-pay | Admitting: Family Medicine

## 2017-03-28 ENCOUNTER — Ambulatory Visit (INDEPENDENT_AMBULATORY_CARE_PROVIDER_SITE_OTHER): Payer: Managed Care, Other (non HMO) | Admitting: Family Medicine

## 2017-03-28 VITALS — BP 111/74 | HR 63 | Ht 66.0 in | Wt 120.0 lb

## 2017-03-28 DIAGNOSIS — R6882 Decreased libido: Secondary | ICD-10-CM | POA: Insufficient documentation

## 2017-03-28 DIAGNOSIS — F411 Generalized anxiety disorder: Secondary | ICD-10-CM | POA: Diagnosis not present

## 2017-03-28 DIAGNOSIS — R5383 Other fatigue: Secondary | ICD-10-CM | POA: Diagnosis not present

## 2017-03-28 DIAGNOSIS — F321 Major depressive disorder, single episode, moderate: Secondary | ICD-10-CM | POA: Diagnosis not present

## 2017-03-28 MED ORDER — VORTIOXETINE HBR 10 MG PO TABS: 10.0000 mg | ORAL_TABLET | Freq: Every day | ORAL | 12 refills | Status: DC

## 2017-03-28 NOTE — Progress Notes (Signed)
Amanda Simmons is a 22 y.o. female who [facility] today for Depression, anxiety and decreased libido/poor quality sex.   Depression/Anxiety: [name] has a past medical history significant for depression.  This is been ongoing for 4 years off on after the death of her grandmother who essentially raised her. She has had periods of treatment and periods of remission. She notes that over the last few months she has had worsening depression and anxiety. She feels fatigued, low energy trouble sleeping and irritability. (see phq9 and gad7 below). She feels that her symptoms are bothersome enough to require treatment. She had had therapy and not liked it much. 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 She does not have a lot of friends and feels isolated at home. She is planning on returning to school in the next few months and thinks that school will help. She notes that in the past she did not tolerate zoloft well. She denies any SI/HI. She is able to fall asleep and gets 8 hours of sleep most nights but sometimes have trouble falling back asleep when her baby wakes her up.   Low Libido: [name] notes decreased libido during and following her pregnancy. She does not feel much sex drive and when she does have sex she does not get much pleasure from it. She does not find penetrative vaginal sex painful just not pleasurable. She has not talked much to her partner about this yet. She does find her low libido to be a problem and she would like to a sex drive again and enjoy sex again. She notes that in the past she was able to have enjoyable sex and achieve orgasm.    Past Medical History:  Diagnosis Date  . ADD (attention deficit disorder)   . Anxiety   . Depression 08/16/2015   PHQ-9 was 11.     Past Surgical History:  Procedure Laterality Date  . MOUTH  SURGERY     Social History   Tobacco Use  . Smoking status: Never Smoker  . Smokeless tobacco: Former Engineer, waterUser  Substance Use Topics  . Alcohol use: No    Alcohol/week: 0.0 oz   family history is not on file.  ROS as above:  Medications: Current Outpatient Medications  Medication Sig Dispense Refill  . vortioxetine HBr (TRINTELLIX) 10 MG TABS tablet Take 1 tablet (10 mg total) by mouth daily. 30 tablet 12   No current facility-administered medications for this visit.    No Known Allergies  Health Maintenance Health Maintenance  Topic Date Due  . CHLAMYDIA SCREENING  02/28/2017  . INFLUENZA VACCINE  11/28/2017 (Originally 09/05/2016)  . TETANUS/TDAP  03/28/2018 (Originally 04/08/2015)  . HIV Screening  Completed     Exam:  BP 111/74   Pulse 63   Ht 5\' 6"  (1.676 m)   Wt 120 lb (54.4 kg)   BMI 19.37 kg/m  Gen: Well NAD HEENT: EOMI,  MMM Lungs: Normal work of breathing. CTABL Heart: RRR no MRG Abd: NABS, Soft. Nondistended, Nontender Exts: Brisk capillary refill, warm and well perfused.  Psych: Alert and oriented. Well appearing. Normal affect and speech. No SI or HI.   [facility] 03/28/2017 10/11/2016  Decreased Interest 1 1  Down, Depressed, Hopeless 0 1  PHQ - 2 Score 1 2  Altered sleeping 1 1  Tired, decreased energy 2 1  Change in appetite 1  1  Feeling bad or failure about yourself  1 0  Trouble concentrating 0 1  Moving slowly or fidgety/restless 0 0  Suicidal thoughts 0 0  PHQ-9 Score 6 6  Difficult doing work/chores Somewhat difficult Somewhat difficult  Some encounter information is confidential and restricted. Go to Review Flowsheets activity to see all data.   GAD 7 : Generalized Anxiety Score 03/28/2017 10/11/2016  Nervous, Anxious, on Edge 1 0  Control/stop worrying 1 0  Worry too much - different things 1 1  Trouble relaxing 0 0  Restless 0 0  Easily annoyed or irritable 1 1  Afraid - awful might happen 0 0  Total GAD 7 Score 4 2    Anxiety Difficulty Somewhat difficult Somewhat difficult       No results found for this or any previous visit (from the past 72 hour(s)). No results found.    Assessment and Plan: 22 y.o. female with  Depression: Worsening established problem. I agree we should start treatment. Plan for Trintelix. Patient wants to wait on therapy at this time. Will avoid SSRI class due to risk of decreased libido and due to history of not tolerating Zoloft.   Anxiety: Worsening established problem. Trintellix as above.  Recheck depression and anxiety in 1 month.   Low Libido: Unclear etiology. Will complete a limited metabolic and endocrine workup listed below. I think depression is playing a factor here. If no improvement next steps would be OBGYN and referral to sex therapy.   Recheck in 1 month.    Orders Placed This Encounter  Procedures  . CBC  . COMPLETE METABOLIC PANEL WITH GFR  . TSH  . Estrogens, total  . Progesterone  . FSH/LH   Meds ordered this encounter  Medications  . vortioxetine HBr (TRINTELLIX) 10 MG TABS tablet    Sig: Take 1 tablet (10 mg total) by mouth daily.    Dispense:  30 tablet    Refill:  12     Discussed warning signs or symptoms. Please see discharge instructions. Patient expresses understanding.

## 2017-03-28 NOTE — Patient Instructions (Addendum)
Thank you for coming in today. Get labs soon.  Let me know if you do no improve.  Start tintellix.  Recheck with me in 1 month.   Vortioxetine oral tablet What is this medicine? Vortioxetine (vor tee [employer] e teen) is used to treat depression. This medicine may be used for other purposes; ask your health care provider or pharmacist if you have questions. COMMON BRAND NAME(S): BRINTELLIX, Trintellix What should I tell my health care provider before I take this medicine? They need to know if you have any of these conditions: -bipolar disorder or a family history of bipolar disorder -bleeding disorders -drink alcohol -glaucoma -liver disease -low levels of sodium in the blood -seizures -suicidal thoughts, plans, or attempt; a previous suicide attempt by you or a family member -take medicines that treat or prevent blood clots -an unusual or allergic reaction to vortioxetine, other medicines, foods, dyes, or preservatives -pregnant or trying to get pregnant -breast-feeding How should I use this medicine? Take this medicine by mouth with a glass of water. Follow the directions on the prescription label. You can take it with or without food. If it upsets your stomach, take it with food. Take your medicine at regular intervals. Do not take it more often than directed. Do not stop taking this medicine suddenly except upon the advice of your doctor. Stopping this medicine too quickly may cause serious side effects or your condition may worsen. A special MedGuide will be given to you by the pharmacist with each prescription and refill. Be sure to read this information carefully each time. Talk to your pediatrician regarding the use of this medicine in children. Special care may be needed. Overdosage: If you think you have taken too much of this medicine contact a poison control center or emergency room at once. NOTE: This medicine is only for you. Do not share this medicine with others. What if I  miss a dose? If you miss a dose, take it as soon as you can. If it is almost time for your next dose, take only that dose. Do not take double or extra doses. What may interact with this medicine? Do not take this medicine with any of the following medications: -linezolid -MAOIs like Carbex, Eldepryl, Marplan, Nardil, and Parnate -methylene blue (injected into a vein) This medicine may also interact with the following medications: -alcohol -aspirin and aspirin-like medicines -carbamazepine -certain medicines for depression, anxiety, or psychotic disturbances -certain medicines for migraine headache like almotriptan, eletriptan, frovatriptan, naratriptan, rizatriptan, sumatriptan, zolmitriptan -diuretics -fentanyl -furazolidone -isoniazid -medicines that treat or prevent blood clots like warfarin, enoxaparin, and dalteparin -NSAIDs, medicines for pain and inflammation, like ibuprofen or naproxen -phenytoin -procarbazine -quinidine -rasagiline -rifampin -supplements like St. John's wort, kava kava, valerian -tramadol -tryptophan This list may not describe all possible interactions. Give your health care provider a list of all the medicines, herbs, non-prescription drugs, or dietary supplements you use. Also tell them if you smoke, drink alcohol, or use illegal drugs. Some items may interact with your medicine. What should I watch for while using this medicine? Tell your doctor if your symptoms do not get better or if they get worse. Visit your doctor or health care professional for regular checks on your progress. Because it may take several weeks to see the full effects of this medicine, it is important to continue your treatment as prescribed by your doctor. Patients and their families should watch out for new or worsening thoughts of suicide or depression. Also watch out  for sudden changes in feelings such as feeling anxious, agitated, panicky, irritable, hostile, aggressive, impulsive,  severely restless, overly excited and hyperactive, or not being able to sleep. If this happens, especially at the beginning of treatment or after a change in dose, call your health care professional. Bonita QuinYou may get drowsy or dizzy. Do not drive, use machinery, or do anything that needs mental alertness until you know how this medicine affects you. Do not stand or sit up quickly, especially if you are an older patient. This reduces the risk of dizzy or fainting spells. Alcohol may interfere with the effect of this medicine. Avoid alcoholic drinks. Your mouth may get dry. Chewing sugarless gum or sucking hard candy, and drinking plenty of water may help. Contact your doctor if the problem does not go away or is severe. What side effects may I notice from receiving this medicine? Side effects that you should report to your doctor or health care professional as soon as possible: -allergic reactions like skin rash, itching or hives, swelling of the face, lips, or tongue -anxious -black, tarry stools -changes in vision -confusion -elevated mood, decreased need for sleep, racing thoughts, impulsive behavior -eye pain -fast, irregular heartbeat -feeling faint or lightheaded, falls -feeling agitated, angry, or irritable -hallucination, loss of contact with reality -loss of balance or coordination -loss of memory -painful or prolonged erections -restlessness, pacing, inability to keep still -seizures -stiff muscles -suicidal thoughts or other mood changes -trouble sleeping -unusual bleeding or bruising -unusually weak or tired -vomiting Side effects that usually do not require medical attention (report to your doctor or health care professional if they continue or are bothersome): -change in appetite or weight -change in sex drive or performance -constipation -dizziness -dry mouth -nausea This list may not describe all possible side effects. Call your doctor for medical advice about side  effects. You may report side effects to FDA at 1-800-FDA-1088. Where should I keep my medicine? Keep out of the reach of children. Store at room temperature between 15 and 30 degrees C (59 and 86 degrees F). Throw away any unused medicine after the expiration date. NOTE: This sheet is a summary. It may not cover all possible information. If you have questions about this medicine, talk to your doctor, pharmacist, or health care provider.  2018 Elsevier/Gold Standard (2015-06-23 16:45:13)

## 2017-03-29 ENCOUNTER — Telehealth: Payer: Self-pay | Admitting: Family Medicine

## 2017-03-29 NOTE — Telephone Encounter (Signed)
Received fax from [employer] that Trintellix requires a PA. Awaiting determination-HM.

## 2017-04-25 ENCOUNTER — Ambulatory Visit: Payer: Self-pay | Admitting: Family Medicine

## 2017-08-21 LAB — HM PAP SMEAR: HM Pap smear: NEGATIVE

## 2017-10-11 ENCOUNTER — Encounter: Payer: Self-pay | Admitting: Family Medicine

## 2017-11-05 ENCOUNTER — Ambulatory Visit (INDEPENDENT_AMBULATORY_CARE_PROVIDER_SITE_OTHER): Payer: Managed Care, Other (non HMO) | Admitting: Family Medicine

## 2017-11-05 ENCOUNTER — Encounter: Payer: Self-pay | Admitting: Family Medicine

## 2017-11-05 VITALS — BP 119/82 | HR 67 | Temp 98.0°F | Ht 66.0 in | Wt 119.0 lb

## 2017-11-05 DIAGNOSIS — G4709 Other insomnia: Secondary | ICD-10-CM

## 2017-11-05 DIAGNOSIS — F321 Major depressive disorder, single episode, moderate: Secondary | ICD-10-CM

## 2017-11-05 DIAGNOSIS — L989 Disorder of the skin and subcutaneous tissue, unspecified: Secondary | ICD-10-CM | POA: Diagnosis not present

## 2017-11-05 DIAGNOSIS — L237 Allergic contact dermatitis due to plants, except food: Secondary | ICD-10-CM | POA: Diagnosis not present

## 2017-11-05 MED ORDER — VORTIOXETINE HBR 10 MG PO TABS: 10.0000 mg | ORAL_TABLET | Freq: Every day | ORAL | 12 refills | Status: DC

## 2017-11-05 MED ORDER — TRIAMCINOLONE ACETONIDE 0.5 % EX CREA: 1.0000 "application " | TOPICAL_CREAM | Freq: Two times a day (BID) | CUTANEOUS | 3 refills | Status: DC

## 2017-11-05 MED ORDER — DOXYCYCLINE HYCLATE 100 MG PO TABS: 100.0000 mg | ORAL_TABLET | Freq: Two times a day (BID) | ORAL | 0 refills | Status: DC

## 2017-11-05 NOTE — Patient Instructions (Addendum)
Thank you for coming in today. Take oral doxycycline to settle down inflammation on bump on forehead.  Recheck if not improved.   For rash use triamcinolone cream on the itchy red spots.   Restart trintellix and recheck in 1 month.   Let me know if you have trouble getting medicine.

## 2017-11-05 NOTE — Progress Notes (Signed)
Amanda Simmons is a 22 y.o. female who presents to [facility] [place]: Primary Care Sports Medicine today for itchy rash, skin lesion forehead, insomnia and depression.  Rodnisha notes a 88-month history of irritated inflamed skin between her eyebrows on her forehead.  She notes that she has been picking at it and feels as though there is a hard lump just under the skin.  She is unable to get much pus to express.  She denies fevers chills nausea vomiting or diarrhea.  She notes is a bit painful.  She has not had much treatment for this yet.  Additionally she notes an itchy rash occurring on her right leg and trunk.  This is been present for a few days and consistent with poison ivy dermatitis.  She thinks she may have been exposed recently.  She has not had much treatment tried yet for this either.  No fevers chills nausea vomiting diarrhea or new medications.  Lastly she has a history of depression and insomnia.  She notes this is also associate with irritability.  In the past she was prescribed Trintellix.  She took it for a month but then stopped taking it.  She is not quite sure why she stopped it.  She notes she tolerated it and it helped a bit.  She notes that she had difficulty tolerating Zoloft in the past which caused significantly reduced libido and difficulty with orgasm.   ROS as above:  Exam:  BP 119/82   Pulse 67   Temp 98 F (36.7 C) (Oral)   Ht 5\' 6"  (1.676 m)   Wt 119 lb (54 kg)   BMI 19.21 kg/m  Wt Readings from Last 5 Encounters:  11/05/17 119 lb (54 kg)  03/28/17 120 lb (54.4 kg)  10/11/16 135 lb (61.2 kg)  09/20/16 133 lb (60.3 kg)  09/13/16 137 lb (62.1 kg)    Gen: Well NAD HEENT: EOMI,  MMM Lungs: Normal work of breathing. CTABL Heart: RRR no MRG Abd: NABS, Soft. Nondistended, Nontender Exts: Brisk capillary refill, warm and well perfused.  Skin: Erythematous lesion  forehead between eyebrows.  Firm mobile subcutaneous nodule approximately 3 mm present deep to erythema.  No expressible pus.  Minimally tender.  Additionally patient has linear streaks of vesicles on trunk and extremities consistent appearance with poison ivy dermatitis.  Psych alert and oriented normal speech thought process and affect.  No SI or HI expressed.  Depression screen [facility] 2/9 11/05/2017 03/28/2017 10/11/2016  Decreased Interest 1 1 1   Down, Depressed, Hopeless 1 0 1  PHQ - 2 Score 2 1 2   Altered sleeping 1 1 1   Tired, decreased energy 1 2 1   Change in appetite 1 1 1   Feeling bad or failure about yourself  1 1 0  Trouble concentrating 0 0 1  Moving slowly or fidgety/restless 0 0 0  Suicidal thoughts 0 0 0  PHQ-9 Score 6 6 6   Difficult doing work/chores Somewhat difficult Somewhat difficult Somewhat difficult  Some encounter information is confidential and restricted. Go to Review Flowsheets activity to see all data.   GAD 7 : Generalized Anxiety Score 11/05/2017 03/28/2017 10/11/2016  Nervous, Anxious, on Edge 1 1 0  Control/stop worrying 1 1 0  Worry too much - different things 1 1 1   Trouble relaxing 1 0 0  Restless 1 0 0  Easily annoyed or irritable 1 1 1   Afraid - awful might happen 2 0 0  Total GAD 7 Score 8 4 2   Anxiety Difficulty Somewhat difficult Somewhat difficult Somewhat difficult        Assessment and Plan: 22 y.o. female with  Skin lesion forehead.  Erythematous irritated skin.  There also is a subcutaneous nodule which may be scar or a noninfected sebaceous cyst.  Plan for 2-week trial of doxycycline.  Recheck in 1 month.  Skin lesion extremities: Likely poison ivy dermatitis.  Plan for triamcinolone cream.  Insomnia depression and anxiety symptoms.  Not well controlled.  Restart Trintellix and recheck in 1 month.   No orders of the defined types were placed in this encounter.  Meds ordered this encounter  Medications  . triamcinolone cream  (KENALOG) 0.5 %    Sig: Apply 1 application topically 2 (two) times daily. To affected areas.    Dispense:  30 g    Refill:  3  . doxycycline (VIBRA-TABS) 100 MG tablet    Sig: Take 1 tablet (100 mg total) by mouth 2 (two) times daily.    Dispense:  28 tablet    Refill:  0  . vortioxetine HBr (TRINTELLIX) 10 MG TABS tablet    Sig: Take 1 tablet (10 mg total) by mouth daily.    Dispense:  30 tablet    Refill:  12    Failed zoloft     Historical information moved to improve visibility of documentation.  Past Medical History:  Diagnosis Date  . ADD (attention deficit disorder)   . Anxiety   . Depression 08/16/2015   PHQ-9 was 11.     Past Surgical History:  Procedure Laterality Date  . MOUTH SURGERY     Social History   Tobacco Use  . Smoking status: Never Smoker  . Smokeless tobacco: Former Engineer, water Use Topics  . Alcohol use: No    Alcohol/week: 0.0 standard drinks   family history is not on file.  Medications: Current Outpatient Medications  Medication Sig Dispense Refill  . vortioxetine HBr (TRINTELLIX) 10 MG TABS tablet Take 1 tablet (10 mg total) by mouth daily. 30 tablet 12  . doxycycline (VIBRA-TABS) 100 MG tablet Take 1 tablet (100 mg total) by mouth 2 (two) times daily. 28 tablet 0  . triamcinolone cream (KENALOG) 0.5 % Apply 1 application topically 2 (two) times daily. To affected areas. 30 g 3   No current facility-administered medications for this visit.    No Known Allergies   Discussed warning signs or symptoms. Please see discharge instructions. Patient expresses understanding.

## 2017-11-08 ENCOUNTER — Telehealth: Payer: Self-pay

## 2017-11-08 MED ORDER — VENLAFAXINE HCL ER 75 MG PO CP24: 75.0000 mg | ORAL_CAPSULE | Freq: Every day | ORAL | 1 refills | Status: DC

## 2017-11-08 NOTE — Telephone Encounter (Signed)
Patient called stated that she was seen on this week for depression. Patient stated that she is not taking the medication that was prescribed to her because it had her crying a lot and vomitting. Please advise on what patient should do. Rhonda Cunningham,CMA

## 2017-11-08 NOTE — Telephone Encounter (Signed)
Try taking the antibiotic doxycycline with a bit of food.  Avoid a lot of milk with it.  Do not take on empty stomach.  If he cannot tolerate the full pill try breaking it in half.  I think the Trintellix is probably the main problem here.  Plan to stop Trintellix.    Start Effexor (venlafaxine).  Medication sent to [facility] if not doing well.

## 2017-11-11 NOTE — Telephone Encounter (Signed)
Left VM for Pt to return clinic call.  

## 2017-11-11 NOTE — Telephone Encounter (Signed)
Spoke with Pt, she states the Trintellix was the problem so she stopped taking it. Advised of the new Effexor Rx that has been sent. She states her boyfriends mother takes that, and has really good things to say about it. She is optimist it will work for her too.  Advised to contact clinic in a week with an update on how she's doing with the Rx change. Strongly advised if her mood does not improve or gets any worse, to come see PCP ASAP. Verbalized understanding.

## 2017-12-06 ENCOUNTER — Ambulatory Visit: Payer: Self-pay | Admitting: Family Medicine

## 2018-03-17 ENCOUNTER — Telehealth: Payer: Self-pay

## 2018-03-17 DIAGNOSIS — F411 Generalized anxiety disorder: Secondary | ICD-10-CM

## 2018-03-17 DIAGNOSIS — F429 Obsessive-compulsive disorder, unspecified: Secondary | ICD-10-CM

## 2018-03-17 NOTE — Telephone Encounter (Signed)
Amanda Simmons is going through a hard time. Her OCD is really bad right now. She can't stop cleaning. She is not getting enjoyment from anything in her life. No enjoyment with her daughter, boyfriend or friends. She feels like what is the point. She can't find happiness in anything. She states the medications that have been prescribed in the past didn't help and made things worse. She cries two hours nightly. She is unable to come in until next week. She denies wanting to harm herself or others.

## 2018-03-18 NOTE — Telephone Encounter (Signed)
Please contact patient and advise to schedule appointment with me ASAP.  I will also start the referral process to behavioral health.

## 2018-03-18 NOTE — Telephone Encounter (Signed)
Patient advised and scheduled.  

## 2018-03-24 ENCOUNTER — Encounter: Payer: Self-pay | Admitting: Family Medicine

## 2018-03-24 ENCOUNTER — Ambulatory Visit (INDEPENDENT_AMBULATORY_CARE_PROVIDER_SITE_OTHER): Payer: 59 | Admitting: Family Medicine

## 2018-03-24 VITALS — BP 125/79 | HR 65 | Ht 66.0 in | Wt 127.0 lb

## 2018-03-24 DIAGNOSIS — F422 Mixed obsessional thoughts and acts: Secondary | ICD-10-CM | POA: Diagnosis not present

## 2018-03-24 DIAGNOSIS — F321 Major depressive disorder, single episode, moderate: Secondary | ICD-10-CM | POA: Diagnosis not present

## 2018-03-24 DIAGNOSIS — F411 Generalized anxiety disorder: Secondary | ICD-10-CM | POA: Diagnosis not present

## 2018-03-24 DIAGNOSIS — F429 Obsessive-compulsive disorder, unspecified: Secondary | ICD-10-CM | POA: Insufficient documentation

## 2018-03-24 MED ORDER — FLUOXETINE HCL 10 MG PO CAPS: ORAL_CAPSULE | ORAL | 3 refills | Status: DC

## 2018-03-24 NOTE — Progress Notes (Signed)
Amanda Simmons is a 23 y.o. female who presents to [facility]: Primary Care Sports Medicine today for follow-up mood.  Amanda Simmons has a history of anxiety and depressive symptoms.  This is been ongoing for several years and worsened after the birth of her child.  She had a short course of sertraline that caused nausea.  Additionally she tried Trintellix which caused nausea as well.  She tried Effexor for a month or 2 and notes that it did not help much at all.  She notes significant anxiety symptoms as well as some depressive symptoms.  Additionally she notes OCD type symptoms with excessive cleaning.  She feels compelled to clean and if she does not clean she finds it very anxiety provoking.  She finds the symptoms to be very obnoxious and is eager for potential treatment options.  She contacted my office with the symptoms last week and this is her first appointment.  However she already has been referred to behavioral health and has an initial evaluation on February 19.   ROS as above:  Exam:  BP 125/79   Pulse 65   Ht 5\' 6"  (1.676 m)   Wt 127 lb (57.6 kg)   BMI 20.50 kg/m  Wt Readings from Last 5 Encounters:  03/24/18 127 lb (57.6 kg)  11/05/17 119 lb (54 kg)  03/28/17 120 lb (54.4 kg)  10/11/16 135 lb (61.2 kg)  09/20/16 133 lb (60.3 kg)    Gen: Well NAD HEENT: EOMI,  MMM Lungs: Normal work of breathing. CTABL Heart: RRR no MRG Abd: NABS, Soft. Nondistended, Nontender Exts: Brisk capillary refill, warm and well perfused.   Psych alert and oriented normal speech thought process and affect.  No SI or HI expressed.  Depression screen [facility] 2/9 03/24/2018 11/05/2017 03/28/2017 10/11/2016 09/20/2015  Decreased Interest 1 1 1 1 1   Down, Depressed, Hopeless 1 1 0 1 1  PHQ - 2 Score 2 2 1 2 2   Altered sleeping 1 1 1 1 1   Tired, decreased energy 1 1 2 1 1   Change in appetite 1 1 1 1 1   Feeling bad  or failure about yourself  1 1 1  0 1  Trouble concentrating 0 0 0 1 0  Moving slowly or fidgety/restless 0 0 0 0 0  Suicidal thoughts 0 0 0 0 0  PHQ-9 Score 6 6 6 6 6   Difficult doing work/chores Somewhat difficult Somewhat difficult Somewhat difficult Somewhat difficult Somewhat difficult    GAD 7 : Generalized Anxiety Score 03/24/2018 11/05/2017 03/28/2017 10/11/2016  Nervous, Anxious, on Edge 2 1 1  0  Control/stop worrying 1 1 1  0  Worry too much - different things 1 1 1 1   Trouble relaxing 1 1 0 0  Restless 1 1 0 0  Easily annoyed or irritable 2 1 1 1   Afraid - awful might happen 2 2 0 0  Total GAD 7 Score 10 8 4 2   Anxiety Difficulty Somewhat difficult Somewhat difficult Somewhat difficult Somewhat difficult      Assessment and Plan: 23 y.o. female with  Anxiety and depressive symptoms along with OCD symptoms.  Discussed options.  Plan for treatment with low-dose fluoxetine.  Patient has had side effects with other SSRIs in the past.  Plan to titrate to 20 mg in 2 weeks.  Recheck in 3 weeks.  Return sooner if needed.  PDMP not reviewed this encounter. No orders of the defined types were placed in  this encounter.  Meds ordered this encounter  Medications  . FLUoxetine (PROZAC) 10 MG capsule    Sig: Take 1 capsule (10 mg total) by mouth daily for 14 days, THEN 2 capsules (20 mg total) daily for 14 days.    Dispense:  42 capsule    Refill:  3     Historical information moved to improve visibility of documentation.  Past Medical History:  Diagnosis Date  . ADD (attention deficit disorder)   . Anxiety   . Depression 08/16/2015   PHQ-9 was 11.     Past Surgical History:  Procedure Laterality Date  . MOUTH SURGERY     Social History   Tobacco Use  . Smoking status: Never Smoker  . Smokeless tobacco: Former Engineer, water Use Topics  . Alcohol use: No    Alcohol/week: 0.0 standard drinks   family history is not on file.  Medications: Current Outpatient  Medications  Medication Sig Dispense Refill  . FLUoxetine (PROZAC) 10 MG capsule Take 1 capsule (10 mg total) by mouth daily for 14 days, THEN 2 capsules (20 mg total) daily for 14 days. 42 capsule 3   No current facility-administered medications for this visit.    Allergies  Allergen Reactions  . Effexor [Venlafaxine] Nausea And Vomiting  . Trintellix [Vortioxetine]     Made her mad/angry     Discussed warning signs or symptoms. Please see discharge instructions. Patient expresses understanding.

## 2018-03-24 NOTE — Patient Instructions (Addendum)
Thank you for coming in today. Keep you appointment on the 19th.  Start prozac 10.  Increase to prozac 20 in 2 weeks.  Recheck with me in 3-4 weeks.  Will continue to increase.  Keep me updated via mychart.   Let me know if you do not tolerate Prozac.    Fluoxetine capsules or tablets (Depression/Mood Disorders) What is this medicine? FLUOXETINE (floo OX e teen) belongs to a class of drugs known as selective serotonin reuptake inhibitors (SSRIs). It helps to treat mood problems such as depression, obsessive compulsive disorder, and panic attacks. It can also treat certain eating disorders. This medicine may be used for other purposes; ask your health care provider or pharmacist if you have questions. COMMON BRAND NAME(S): Prozac What should I tell my health care provider before I take this medicine? They need to know if you have any of these conditions: -bipolar disorder or a family history of bipolar disorder -bleeding disorders -glaucoma -heart disease -liver disease -low levels of sodium in the blood -seizures -suicidal thoughts, plans, or attempt; a previous suicide attempt by you or a family member -take MAOIs like Carbex, Eldepryl, Marplan, Nardil, and Parnate -take medicines that treat or prevent blood clots -thyroid disease -an unusual or allergic reaction to fluoxetine, other medicines, foods, dyes, or preservatives -pregnant or trying to get pregnant -breast-feeding How should I use this medicine? Take this medicine by mouth with a glass of water. Follow the directions on the prescription label. You can take this medicine with or without food. Take your medicine at regular intervals. Do not take it more often than directed. Do not stop taking this medicine suddenly except upon the advice of your doctor. Stopping this medicine too quickly may cause serious side effects or your condition may worsen. A special MedGuide will be given to you by the pharmacist with each  prescription and refill. Be sure to read this information carefully each time. Talk to your pediatrician regarding the use of this medicine in children. While this drug may be prescribed for children as young as 7 years for selected conditions, precautions do apply. Overdosage: If you think you have taken too much of this medicine contact a poison control center or emergency room at once. NOTE: This medicine is only for you. Do not share this medicine with others. What if I miss a dose? If you miss a dose, skip the missed dose and go back to your regular dosing schedule. Do not take double or extra doses. What may interact with this medicine? Do not take this medicine with any of the following medications: -other medicines containing fluoxetine, like Sarafem or Symbyax -cisapride -dronedarone -linezolid -MAOIs like Carbex, Eldepryl, Marplan, Nardil, and Parnate -methylene blue (injected into a vein) -pimozide -thioridazine This medicine may also interact with the following medications: -alcohol -amphetamines -aspirin and aspirin-like medicines -carbamazepine -certain medicines for depression, anxiety, or psychotic disturbances -certain medicines for migraine headaches like almotriptan, eletriptan, frovatriptan, naratriptan, rizatriptan, sumatriptan, zolmitriptan -digoxin -diuretics -fentanyl -flecainide -furazolidone -isoniazid -lithium -medicines for sleep -medicines that treat or prevent blood clots like warfarin, enoxaparin, and dalteparin -NSAIDs, medicines for pain and inflammation, like ibuprofen or naproxen -other medicines that prolong the QT interval (an abnormal heart rhythm) -phenytoin -procarbazine -propafenone -rasagiline -ritonavir -supplements like St. John's wort, kava kava, valerian -tramadol -tryptophan -vinblastine This list may not describe all possible interactions. Give your health care provider a list of all the medicines, herbs, non-prescription  drugs, or dietary supplements you use. Also tell them  if you smoke, drink alcohol, or use illegal drugs. Some items may interact with your medicine. What should I watch for while using this medicine? Tell your doctor if your symptoms do not get better or if they get worse. Visit your doctor or health care professional for regular checks on your progress. Because it may take several weeks to see the full effects of this medicine, it is important to continue your treatment as prescribed by your doctor. Patients and their families should watch out for new or worsening thoughts of suicide or depression. Also watch out for sudden changes in feelings such as feeling anxious, agitated, panicky, irritable, hostile, aggressive, impulsive, severely restless, overly excited and hyperactive, or not being able to sleep. If this happens, especially at the beginning of treatment or after a change in dose, call your health care professional. Bonita QuinYou may get drowsy or dizzy. Do not drive, use machinery, or do anything that needs mental alertness until you know how this medicine affects you. Do not stand or sit up quickly, especially if you are an older patient. This reduces the risk of dizzy or fainting spells. Alcohol may interfere with the effect of this medicine. Avoid alcoholic drinks. Your mouth may get dry. Chewing sugarless gum or sucking hard candy, and drinking plenty of water may help. Contact your doctor if the problem does not go away or is severe. This medicine may affect blood sugar levels. If you have diabetes, check with your doctor or health care professional before you change your diet or the dose of your diabetic medicine. What side effects may I notice from receiving this medicine? Side effects that you should report to your doctor or health care professional as soon as possible: -allergic reactions like skin rash, itching or hives, swelling of the face, lips, or tongue -anxious -black, tarry  stools -breathing problems -changes in vision -confusion -elevated mood, decreased need for sleep, racing thoughts, impulsive behavior -eye pain -fast, irregular heartbeat -feeling faint or lightheaded, falls -feeling agitated, angry, or irritable -hallucination, loss of contact with reality -loss of balance or coordination -loss of memory -painful or prolonged erections -restlessness, pacing, inability to keep still -seizures -stiff muscles -suicidal thoughts or other mood changes -trouble sleeping -unusual bleeding or bruising -unusually weak or tired -vomiting Side effects that usually do not require medical attention (report to your doctor or health care professional if they continue or are bothersome): -change in appetite or weight -change in sex drive or performance -diarrhea -dry mouth -headache -increased sweating -nausea -tremors This list may not describe all possible side effects. Call your doctor for medical advice about side effects. You may report side effects to FDA at 1-800-FDA-1088. Where should I keep my medicine? Keep out of the reach of children. Store at room temperature between 15 and 30 degrees C (59 and 86 degrees F). Throw away any unused medicine after the expiration date. NOTE: This sheet is a summary. It may not cover all possible information. If you have questions about this medicine, talk to your doctor, pharmacist, or health care provider.  2019 Elsevier/Gold Standard (2017-09-12 11:56:53)   Living With Obsessive-Compulsive Disorder If you have been diagnosed with obsessive-compulsive disorder (OCD), you may be relieved that you now know why you have felt or behaved a certain way. You may also feel overwhelmed about the treatment ahead, how to get the support you need, and how to deal with the condition day-to-day. With treatment and support, you can manage your OCD. How to manage  lifestyle changes Managing stress Stress is your body's  reaction to life changes and events, both good and bad. Stress can play a major role in OCD, so it is important to learn how to cope with stress. Some techniques to cope with stress include:  Meditation, muscle relaxation, and breathing exercises.  Exercise. Even a short daily walk can help to lower stress levels.  Getting enough good-quality sleep.  Spending time on hobbies that you enjoy.  Accepting and letting go of things that you cannot change. To deal with stress associated with OCD, your health care provider may recommend exposure and response prevention therapy. In this therapy, you will be exposed to the distressing situation that triggers your compulsion and be prevented from responding to it. With repetition of this process over time, you will no longer feel the distress or need to perform the compulsion. Medicines Your health care provider may suggest certain antidepressant medicines if he or she feels that they will help to improve your condition. Avoid using alcohol and other substances that may prevent your medicines from working properly (may interact). It is also important to:  Talk with your pharmacist or health care provider about all medicines that you take, their possible side effects, and which medicines are safe to take together.  Make it your goal to take part in all treatment decisions (shared decision-making). Ask about possible side effects of medicines that your health care provider recommends, and tell him or her how you feel about having those side effects. It is best if shared decision-making with your health care provider is part of your total treatment plan. If you are taking medicines as part of your treatment, do not stop taking medicines before you ask your health care provider if it is safe to stop. You may need to have the medicine slowly decreased (tapered) over time to lower the risk of harmful side effects. Relationships Your family and friends may need to  learn about your OCD in order to cope with your condition and support you. Consider giving education materials to friends and family. Family therapy may also help to lower stress and relieve tension. How to recognize changes in your condition Some signs that your condition may be getting worse include:  Being anxious about germs or dirt.  Having harmful thoughts about yourself or others.  Making sure that household objects are alike or perfectly organized in a specific way.  Having great difficulty making decisions, or second-guessing yourself after making a decision.  Constant cleaning and handwashing.  Repeating behavior such as repeatedly checking to see if a door is locked or the oven is off.  Counting nonstop or uncontrollably. Where to find support Talking with others It may be difficult to tell loved ones about your condition, but they can be a good support system for you. You can work with your therapist to decide whom to tell and when to tell them. Here are some tips for starting the conversation:  Start by sharing your experience with OCD. It is up to you how much detail you want to provide.  Let your loved ones know that you are seeking treatment.  Do not expect loved ones to understand your condition right away. Finances Not all insurance plans cover mental health care, so it is important to check with your insurance carrier. If paying for co-pays or counseling services is a problem, search for a local or county mental health care center. Public mental health care services may be offered there  at a low cost or no cost when you are not able to see a private health care provider. If you are taking medicine for depression, you may be able to get the generic form, which may be less expensive than brand-name medicine. Some makers of prescription medicines also offer help to patients who cannot afford the medicines they need. Follow these instructions at home:   Check with your  health care provider before starting any new prescription or over-the-counter medicines.  Ask for support from trusted family members or friends to make sure you stay on-track with your treatment.  Keep all follow-up visits as told by your health care provider and therapist. This is important.  Keep a journal to write down your daily moods, medicines, sleep habits, and life events. Doing this may help you have more success with your treatment.  Maintain a healthy lifestyle. Eat a healthy diet, exercise regularly, get plenty of sleep, and take time to relax. Questions to ask your health care provider  If you are taking medicines: ? How long do I need to take medicine? ? Are there any long-term side effects of my medicine? ? Are there any alternatives to taking medicine?  How would I benefit from therapy?  How often should I follow up with a health care provider? Where to find more information  International OCD Foundation: www.iocdf.org  [employer] (NAMI): AskCollector.com.br Contact a health care provider if:  Your symptoms get worse or they do not get better with treatment.  You develop new symptoms. Get help right away if:  You have severe side effects after taking your medicine.  You have thoughts about hurting yourself or others. If you ever feel like you may hurt yourself or others, or have thoughts about taking your own life, get help right away. You can go to your nearest emergency department or call:  Your local emergency services (911 in the [place]  A suicide crisis helpline, such as the National Suicide Prevention Lifeline at 831-857-7972. This is open 24 hours a day. Summary  Stress can play a major role in obsessive-compulsive disorder (OCD). Learning ways to deal with stress may help your treatment work better for you.  If you are taking medicines as part of your  treatment, do not stop taking medicines before you ask your health care provider if it is safe to stop.  When talking with family members and friends about your OCD, decide how much detail you want to give them and be patient as they work to understand your condition.  Keep all follow-up visits as told by your health care provider and therapist. This is important. This information is not intended to replace advice given to you by your health care provider. Make sure you discuss any questions you have with your health care provider. Document Released: 05/24/2016 Document Revised: 05/24/2016 Document Reviewed: 05/24/2016 Elsevier Interactive Patient Education  2019 [employer].

## 2018-03-26 ENCOUNTER — Ambulatory Visit (HOSPITAL_COMMUNITY): Payer: 59 | Admitting: Psychiatry

## 2018-04-14 ENCOUNTER — Ambulatory Visit: Payer: Self-pay | Admitting: Family Medicine

## 2018-05-12 ENCOUNTER — Telehealth: Payer: Self-pay

## 2018-05-12 NOTE — Telephone Encounter (Signed)
Amanda Simmons called and left a message stating she has poison oak. I called and left a message advising her to call back and schedule a MyChart visit.

## 2018-05-13 NOTE — Telephone Encounter (Signed)
Called patient again with no answer. Left voicemail for patient to call us back with information below.

## 2018-05-13 NOTE — Telephone Encounter (Signed)
Shanda Bumps, can you try to reach out to this patient again to schedule, please?

## 2018-05-14 ENCOUNTER — Encounter: Payer: Self-pay | Admitting: Family Medicine

## 2018-05-14 ENCOUNTER — Ambulatory Visit (INDEPENDENT_AMBULATORY_CARE_PROVIDER_SITE_OTHER): Payer: 59 | Admitting: Family Medicine

## 2018-05-14 ENCOUNTER — Other Ambulatory Visit: Payer: Self-pay

## 2018-05-14 VITALS — Temp 98.0°F | Ht 65.0 in | Wt 127.0 lb

## 2018-05-14 DIAGNOSIS — R21 Rash and other nonspecific skin eruption: Secondary | ICD-10-CM

## 2018-05-14 MED ORDER — TRIAMCINOLONE ACETONIDE 0.1 % EX CREA: 1.0000 "application " | TOPICAL_CREAM | Freq: Two times a day (BID) | CUTANEOUS | 3 refills | Status: DC

## 2018-05-14 MED ORDER — HYDROXYZINE HCL 50 MG PO TABS: 50.0000 mg | ORAL_TABLET | Freq: Every evening | ORAL | 3 refills | Status: DC | PRN

## 2018-05-14 NOTE — Progress Notes (Addendum)
Virtual Visit  via Video Note  I connected with      Sheppard EvensBreana N Weimann  by a video enabled telemedicine application and verified that I am speaking with the correct person using two identifiers.   I discussed the limitations of evaluation and management by telemedicine and the availability of in person appointments. The patient expressed understanding and agreed to proceed.   History of Present Illness: Amanda Simmons is a 23 y.o. female who would like to discuss rash.   [name] developed a rash last week after walking through the woods to go fishing with her daughter.  She developed a group of small erythematous papules on her lower extremity.  Her daughter also developed a similar rash.  She notes this is quite itchy.  She is tried some over-the-counter skin numbing topical cream which has helped a little.  She also used baking soda bath which did not help much.  She denies any fevers or chills nausea vomiting or diarrhea.  She denies any new medications have detergent or shampoo  Observations/Objective: Temp 98 F (36.7 C) (Oral)   Ht 5\' 5"  (1.651 m)   Wt 127 lb (57.6 kg)   BMI 21.13 kg/m  Wt Readings from Last 5 Encounters:  05/14/18 127 lb (57.6 kg)  03/24/18 127 lb (57.6 kg)  11/05/17 119 lb (54 kg)  03/28/17 120 lb (54.4 kg)  10/11/16 135 lb (61.2 kg)   Exam: Appearance Normal Speech.    Lab and Radiology Results No results found for this or any previous visit (from the past 72 hour(s)). No results found.   Assessment and Plan: 23 y.o. female with rash.  Very likely chigger arthropod bites.  Plan for triamcinolone cream hydroxyzine as needed at night and recommend daily over-the-counter Zyrtec.  Continue skin numbing cream as needed.  Patient will continue to send pictures through my chart or via email.  Recheck sooner if needed.  PDMP not reviewed this encounter. No orders of the defined types were placed in this encounter.  Meds ordered this encounter   Medications  . triamcinolone cream (KENALOG) 0.1 %    Sig: Apply 1 application topically 2 (two) times daily.    Dispense:  453.6 g    Refill:  3  . hydrOXYzine (ATARAX/VISTARIL) 50 MG tablet    Sig: Take 1 tablet (50 mg total) by mouth at bedtime and may repeat dose one time if needed. For itching.    Dispense:  30 tablet    Refill:  3    Follow Up Instructions:    I discussed the assessment and treatment plan with the patient. The patient was provided an opportunity to ask questions and all were answered. The patient agreed with the plan and demonstrated an understanding of the instructions.   The patient was advised to call back or seek an in-person evaluation if the symptoms worsen or if the condition fails to improve as anticipated.  I provided 15 minutes of non-face-to-face time during this encounter.    Historical information moved to improve visibility of documentation.  Past Medical History:  Diagnosis Date  . ADD (attention deficit disorder)   . Anxiety   . Depression 08/16/2015   PHQ-9 was 11.     Past Surgical History:  Procedure Laterality Date  . MOUTH SURGERY     Social History   Tobacco Use  . Smoking status: Never Smoker  . Smokeless tobacco: Former Engineer, waterUser  Substance Use Topics  . Alcohol use: No  Alcohol/week: 0.0 standard drinks   family history is not on file.  Medications: Current Outpatient Medications  Medication Sig Dispense Refill  . FLUoxetine (PROZAC) 10 MG capsule Take 1 capsule (10 mg total) by mouth daily for 14 days, THEN 2 capsules (20 mg total) daily for 14 days. 42 capsule 3  . hydrOXYzine (ATARAX/VISTARIL) 50 MG tablet Take 1 tablet (50 mg total) by mouth at bedtime and may repeat dose one time if needed. For itching. 30 tablet 3  . triamcinolone cream (KENALOG) 0.1 % Apply 1 application topically 2 (two) times daily. 453.6 g 3   No current facility-administered medications for this visit.    Allergies  Allergen Reactions  .  Effexor [Venlafaxine] Nausea And Vomiting  . Trintellix [Vortioxetine]     Made her mad/angry   Addendum to correct incorrect date due to templating error

## 2018-05-24 IMAGING — DX DG FINGER INDEX 2+V*L*
3 series · 3 of 3 positions shown · non-contrast
Comparison: None.

CLINICAL DATA: Pain and swelling at PIP joint of the index finger
for a week. No trauma history submitted.

EXAM:
LEFT INDEX FINGER 2+V

[finger ap]
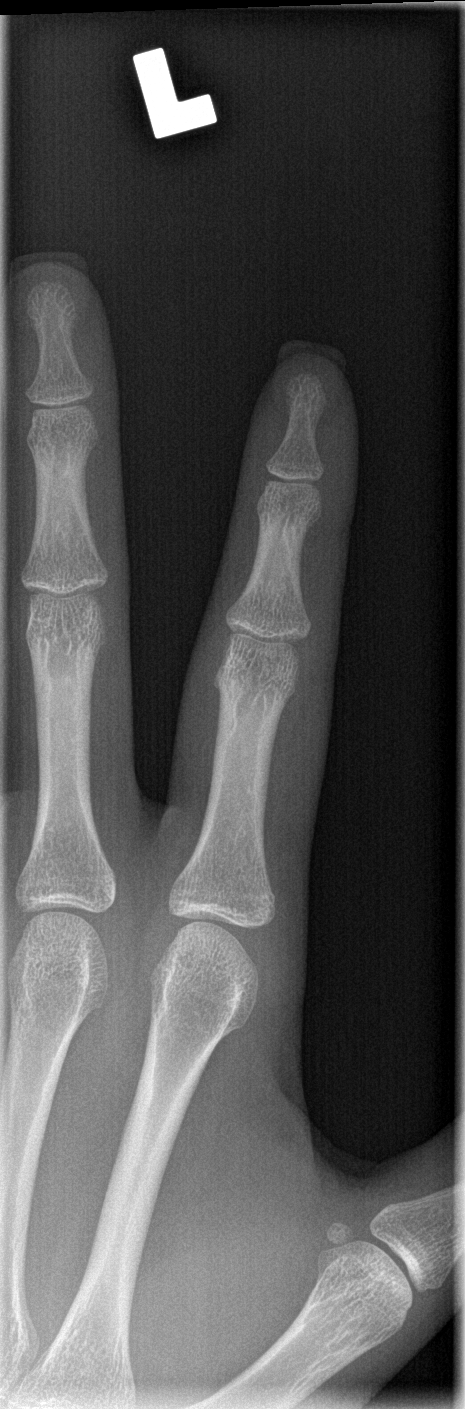

[finger obl]
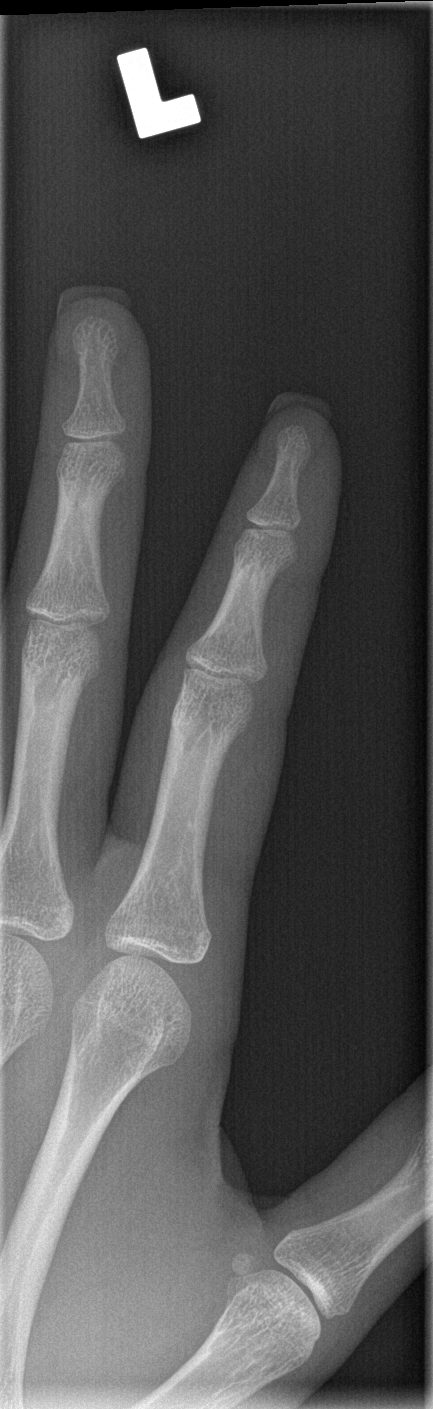

[finger lat]
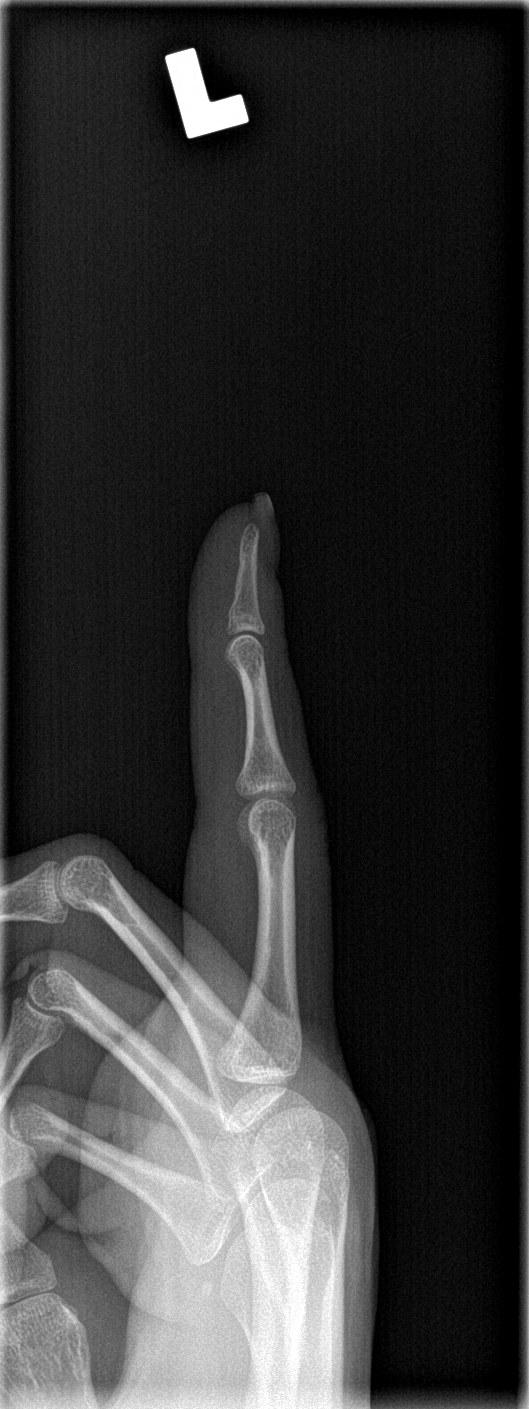

[3 of 3 positions shown; findings below may reference images not displayed]

FINDINGS: No acute fracture or dislocation. Joint spaces maintained. No focal
osseous lesion.
IMPRESSION: No acute osseous abnormality.

## 2018-08-11 ENCOUNTER — Ambulatory Visit (INDEPENDENT_AMBULATORY_CARE_PROVIDER_SITE_OTHER): Payer: 59 | Admitting: Family Medicine

## 2018-08-11 VITALS — Ht 65.0 in | Wt 127.0 lb

## 2018-08-11 DIAGNOSIS — L237 Allergic contact dermatitis due to plants, except food: Secondary | ICD-10-CM

## 2018-08-11 MED ORDER — PREDNISONE 5 MG (48) PO TBPK: ORAL_TABLET | ORAL | 0 refills | Status: DC

## 2018-08-11 MED ORDER — HYDROXYZINE HCL 50 MG PO TABS: 50.0000 mg | ORAL_TABLET | Freq: Every evening | ORAL | 3 refills | Status: DC | PRN

## 2018-08-11 NOTE — Progress Notes (Signed)
Virtual Visit  I connected with      Amanda Simmons  by a telemedicine application and verified that I am speaking with the correct person using two identifiers.   I discussed the limitations of evaluation and management by telemedicine and the availability of in person appointments. The patient expressed understanding and agreed to proceed.  History of Present Illness: Amanda Simmons is a 23 y.o. female who would like to discuss rash.  Patient has developed a rash on her lower extremities torso and upper extremities consistent in the past with poison oak.  She notes it is itchy.  She denies any fevers or chills nausea vomiting diarrhea.  Rashes been present for about 3 days.  She thinks he was exposed to poison oak or poison ivy the day prior.  She is tried calamine lotion I have her rest and rubbing alcohol which has not helped much.  She does have an existing prescription for triamcinolone cream that was prescribed in April for chigger bites but she has not tried using that yet.  Additionally she notes that in the past hydroxyzine is been very helpful but she does not have any more of that either.  She feels well otherwise.  She uses birth control for contraception to prevent pregnancy.  She does not think that she is pregnant or at risk for pregnancy.     Observations/Objective: Ht 5\' 5"  (1.651 m)   Wt 127 lb (57.6 kg)   BMI 21.13 kg/m  Wt Readings from Last 5 Encounters:  08/11/18 127 lb (57.6 kg)  05/14/18 127 lb (57.6 kg)  03/24/18 127 lb (57.6 kg)  11/05/17 119 lb (54 kg)  03/28/17 120 lb (54.4 kg)   Exam: Normal Speech.    Lab and Radiology Results No results found for this or any previous visit (from the past 72 hour(s)). No results found.   Assessment and Plan: 23 y.o. female with poison oak or poison ivy dermatitis.  Plan to treat with triamcinolone cream.  Additionally use hydroxyzine at night as needed.  Backup prednisone prescribed for use if not  improving.  PDMP not reviewed this encounter. No orders of the defined types were placed in this encounter.  Meds ordered this encounter  Medications  . hydrOXYzine (ATARAX/VISTARIL) 50 MG tablet    Sig: Take 1 tablet (50 mg total) by mouth at bedtime and may repeat dose one time if needed. For itching.    Dispense:  30 tablet    Refill:  3  . predniSONE (STERAPRED UNI-PAK 48 TAB) 5 MG (48) TBPK tablet    Sig: 12 day dosepack po    Dispense:  48 tablet    Refill:  0    Follow Up Instructions:    I discussed the assessment and treatment plan with the patient. The patient was provided an opportunity to ask questions and all were answered. The patient agreed with the plan and demonstrated an understanding of the instructions.   The patient was advised to call back or seek an in-person evaluation if the symptoms worsen or if the condition fails to improve as anticipated.  Time: 15 minutes of intraservice time, with >22 minutes of total time during today's visit.      Historical information moved to improve visibility of documentation.  Past Medical History:  Diagnosis Date  . ADD (attention deficit disorder)   . Anxiety   . Depression 08/16/2015   PHQ-9 was 11.     Past Surgical History:  Procedure Laterality Date  . MOUTH SURGERY     Social History   Tobacco Use  . Smoking status: Never Smoker  . Smokeless tobacco: Former Engineer, waterUser  Substance Use Topics  . Alcohol use: No    Alcohol/week: 0.0 standard drinks   family history is not on file.  Medications: Current Outpatient Medications  Medication Sig Dispense Refill  . FLUoxetine (PROZAC) 10 MG capsule Take 1 capsule (10 mg total) by mouth daily for 14 days, THEN 2 capsules (20 mg total) daily for 14 days. 42 capsule 3  . hydrOXYzine (ATARAX/VISTARIL) 50 MG tablet Take 1 tablet (50 mg total) by mouth at bedtime and may repeat dose one time if needed. For itching. 30 tablet 3  . triamcinolone cream (KENALOG) 0.1 % Apply  1 application topically 2 (two) times daily. 453.6 g 3  . predniSONE (STERAPRED UNI-PAK 48 TAB) 5 MG (48) TBPK tablet 12 day dosepack po 48 tablet 0   No current facility-administered medications for this visit.    Allergies  Allergen Reactions  . Effexor [Venlafaxine] Nausea And Vomiting  . Trintellix [Vortioxetine]     Made her mad/angry

## 2019-02-26 ENCOUNTER — Encounter: Payer: Self-pay | Admitting: Nurse Practitioner

## 2019-02-26 ENCOUNTER — Ambulatory Visit (INDEPENDENT_AMBULATORY_CARE_PROVIDER_SITE_OTHER): Payer: 59 | Admitting: Nurse Practitioner

## 2019-02-26 VITALS — Temp 98.7°F | Ht 65.0 in | Wt 127.0 lb

## 2019-02-26 DIAGNOSIS — B373 Candidiasis of vulva and vagina: Secondary | ICD-10-CM | POA: Diagnosis not present

## 2019-02-26 DIAGNOSIS — B3731 Acute candidiasis of vulva and vagina: Secondary | ICD-10-CM

## 2019-02-26 DIAGNOSIS — R3989 Other symptoms and signs involving the genitourinary system: Secondary | ICD-10-CM | POA: Diagnosis not present

## 2019-02-26 MED ORDER — FLUCONAZOLE 150 MG PO TABS: 150.0000 mg | ORAL_TABLET | Freq: Every day | ORAL | 0 refills | Status: AC

## 2019-02-26 MED ORDER — DOXYCYCLINE HYCLATE 100 MG PO TABS: 100.0000 mg | ORAL_TABLET | Freq: Two times a day (BID) | ORAL | 0 refills | Status: DC

## 2019-02-26 NOTE — Progress Notes (Signed)
Symptoms the last 1.5 weeks:  White/creamy vaginal discharge Urinary odor/ occasional burning Some random vaginal burning comes and goes  Having a lot of sex/ no pain with intercourse - states has history of UTI's with intercourse No new low back pain/abdominal pain Has not taken anything OTC for symptoms

## 2019-02-26 NOTE — Patient Instructions (Signed)
Urinary Tract Infection, Adult A urinary tract infection (UTI) is an infection of any part of the urinary tract. The urinary tract includes:  The kidneys.  The ureters.  The bladder.  The urethra. These organs make, store, and get rid of pee (urine) in the body. What are the causes? This is caused by germs (bacteria) in your genital area. These germs grow and cause swelling (inflammation) of your urinary tract. What increases the risk? You are more likely to develop this condition if:  You have a small, thin tube (catheter) to drain pee.  You cannot control when you pee or poop (incontinence).  You are female, and: ? You use these methods to prevent pregnancy:  A medicine that kills sperm (spermicide).  A device that blocks sperm (diaphragm). ? You have low levels of a female hormone (estrogen). ? You are pregnant.  You have genes that add to your risk.  You are sexually active.  You take antibiotic medicines.  You have trouble peeing because of: ? A prostate that is bigger than normal, if you are female. ? A blockage in the part of your body that drains pee from the bladder (urethra). ? A kidney stone. ? A nerve condition that affects your bladder (neurogenic bladder). ? Not getting enough to drink. ? Not peeing often enough.  You have other conditions, such as: ? Diabetes. ? A weak disease-fighting system (immune system). ? Sickle cell disease. ? Gout. ? Injury of the spine. What are the signs or symptoms? Symptoms of this condition include:  Needing to pee right away (urgently).  Peeing often.  Peeing small amounts often.  Pain or burning when peeing.  Blood in the pee.  Pee that smells bad or not like normal.  Trouble peeing.  Pee that is cloudy.  Fluid coming from the vagina, if you are female.  Pain in the belly or lower back. Other symptoms include:  Throwing up (vomiting).  No urge to eat.  Feeling mixed up (confused).  Being tired  and grouchy (irritable).  A fever.  Watery poop (diarrhea). How is this treated? This condition may be treated with:  Antibiotic medicine.  Other medicines.  Drinking enough water. Follow these instructions at home:  Medicines  Take over-the-counter and prescription medicines only as told by your doctor.  If you were prescribed an antibiotic medicine, take it as told by your doctor. Do not stop taking it even if you start to feel better. General instructions  Make sure you: ? Pee until your bladder is empty. ? Do not hold pee for a long time. ? Empty your bladder after sex. ? Wipe from front to back after pooping if you are a female. Use each tissue one time when you wipe.  Drink enough fluid to keep your pee pale yellow.  Keep all follow-up visits as told by your doctor. This is important. Contact a doctor if:  You do not get better after 1-2 days.  Your symptoms go away and then come back. Get help right away if:  You have very bad back pain.  You have very bad pain in your lower belly.  You have a fever.  You are sick to your stomach (nauseous).  You are throwing up. Summary  A urinary tract infection (UTI) is an infection of any part of the urinary tract.  This condition is caused by germs in your genital area.  There are many risk factors for a UTI. These include having a small, thin   tube to drain pee and not being able to control when you pee or poop.  Treatment includes antibiotic medicines for germs.  Drink enough fluid to keep your pee pale yellow. This information is not intended to replace advice given to you by your health care provider. Make sure you discuss any questions you have with your health care provider. Document Revised: 01/09/2018 Document Reviewed: 08/01/2017   Safe Sex Practicing safe sex means taking steps before and during sex to reduce your risk of:  Getting an STI (sexually transmitted infection).  Giving your partner an  STI.  Unwanted or unplanned pregnancy. How can I practice safe sex?     Ways you can practice safe sex  Limit your sexual partners to only one partner who is having sex with only you.  Avoid using alcohol and drugs before having sex. Alcohol and drugs can affect your judgment.  Before having sex with a new partner: ? Talk to your partner about past partners, past STIs, and drug use. ? Get screened for STIs and discuss the results with your partner. Ask your partner to get screened, too.  Check your body regularly for sores, blisters, rashes, or unusual discharge. If you notice any of these problems, visit your health care provider.  Avoid sexual contact if you have symptoms of an infection or you are being treated for an STI.  While having sex, use a condom. Make sure to: ? Use a condom every time you have vaginal, oral, or anal sex. Both females and males should wear condoms during oral sex. ? Keep condoms in place from the beginning to the end of sexual activity. ? Use a latex condom, if possible. Latex condoms offer the best protection. ? Use only water-based lubricants with a condom. Using petroleum-based lubricants or oils will weaken the condom and increase the chance that it will break. Ways your health care provider can help you practice safe sex  See your health care provider for regular screenings, exams, and tests for STIs.  Talk with your health care provider about what kind of birth control (contraception) is best for you.  Get vaccinated against hepatitis B and human papillomavirus (HPV).  If you are at risk of being infected with HIV (human immunodeficiency virus), talk with your health care provider about taking a prescription medicine to prevent HIV infection. You are at risk for HIV if you: ? Are a man who has sex with other men. ? Are sexually active with more than one partner. ? Take drugs by injection. ? Have a sex partner who has HIV. ? Have unprotected  sex. ? Have sex with someone who has sex with both men and women. ? Have had an STI. Follow these instructions at home:  Take over-the-counter and prescription medicines as told by your health care provider.  Keep all follow-up visits as told by your health care provider. This is important. Where to find more information  Centers for Disease Control and Prevention: LessFurn[employer].be  Planned Parenthood: https://www.plannedparenthood.org/  Office on Women's Health: Employment[employer]acking[employer] Summary  Practicing safe sex means taking steps before and during sex to reduce your risk of STIs, giving your partner STIs, and having an unwanted or unplanned pregnancy.  Before having sex with a new partner, talk to your partner about past partners, past STIs, and drug use.  Use a condom every time you have vaginal, oral, or anal sex. Both females and males should wear condoms during oral sex.  Check your body regularly for  sores, blisters, rashes, or unusual discharge. If you notice any of these problems, visit your health care provider.  See your health care provider for regular screenings, exams, and tests for STIs. This information is not intended to replace advice given to you by your health care provider. Make sure you discuss any questions you have with your health care provider. Document Revised: 05/16/2018 Document Reviewed: 11/04/2017 Elsevier Patient Education  2020 [place] is a sexually transmitted disease (STD) that can affect both men and women. If left untreated, this infection can:  Damage the female or female organs.  Cause women and men to be unable to have children (be sterile).  Harm a fetus if an infected woman is pregnant. It is important to get treatment for gonorrhea as soon as possible. It is also necessary for all of your sexual partners to be tested for the  infection. What are the causes? This condition is caused by bacteria called Neisseria gonorrhoeae. The infection is spread from person to person through sexual contact, including oral, anal, and vaginal sex. A newborn can contract the infection from his or her mother during birth. What increases the risk? The following factors may make you more likely to develop this condition:  Being a woman who is younger than 23 years of age and who is sexually active.  Being a woman 60 years of age or older who has: ? A new sex partner. ? More than one sex partner. ? A sex partner who has an STD.  Being a man who has: ? A new sex partner. ? More than one sex partner. ? A sex partner who has an STD.  Using condoms inconsistently.  Currently having, or having previously had, an STD.  Exchanging sex for money or drugs. What are the signs or symptoms? Some people do not have any symptoms. If you do have symptoms, they may be different for females and males. For females  Pain in the lower abdomen.  Abnormal vaginal discharge. The discharge may be cloudy, thick, or yellow-green in color.  Bleeding between periods.  Painful sex.  Burning or itching in and around the vagina.  Pain or burning when urinating.  Irritation, pain, bleeding, or discharge from the rectum. This may occur if the infection was spread by anal sex.  Sore throat or swollen lymph nodes in the neck. This may occur if the infection was spread by oral sex. For males  Abnormal discharge from the penis. This discharge may be cloudy, thick, or yellow-green in color.  Pain or burning during urination.  Pain or swelling in the testicles.  Irritation, pain, bleeding, or discharge from the rectum. This may occur if the infection was spread by anal sex.  Sore throat, fever, or swollen lymph nodes in the neck. This may occur if the infection was spread by oral sex. How is this diagnosed? This condition is diagnosed based on:   A physical exam.  A sample of discharge that is examined under a microscope to look for the bacteria. The discharge may be taken from the urethra, cervix, throat, or rectum.  Urine tests. Not all of test results will be available during your visit. How is this treated? This condition is treated with antibiotic medicines. It is important for treatment to begin as soon as possible. Sharena Dibenedetto treatment may prevent some problems from developing. Do not have sex during treatment. Avoid all types of sexual activity for 7 days after treatment is complete and until any  sex partners have been treated. Follow these instructions at home:  Take over-the-counter and prescription medicines only as told by your health care provider.  Take your antibiotic medicine as told by your health care provider. Do not stop taking the antibiotic even if you start to feel better.  Do not have sex until at least 7 days after you and your partner(s) have finished treatment and your health care provider says it is okay.  It is your responsibility to get your test results. Ask your health care provider, or the department performing the test, when your results will be ready.  If you test positive for gonorrhea, inform your recent sexual partners. This includes any oral, anal, or vaginal sex partners. They need to be checked for gonorrhea even if they do not have symptoms. They may need treatment, even if they test negative for gonorrhea.  Keep all follow-up visits as told by your health care provider. This is important. How is this prevented?   Use latex condoms correctly every time you have sexual intercourse.  Ask if your sexual partner has been tested for STDs and had negative results.  Avoid having multiple sexual partners. Contact a health care provider if:  You develop a bad reaction to the medicine you were prescribed. This may include: ? A rash. ? Nausea. ? Vomiting. ? Diarrhea.  Your symptoms do not get  better after a few days of taking antibiotics.  Your symptoms get worse.  You develop new symptoms.  Your pain gets worse.  You have a fever.  You develop pain, itching, or discharge around the eyes. Get help right away if:  You feel dizzy or faint.  You have trouble breathing or have shortness of breath.  You develop an irregular heartbeat.  You have severe abdominal pain with or without shoulder pain.  You develop any bumps or sores (lesions) on your skin.  You develop warmth, redness, pain, or swelling around your joints, such as the knee. Summary  Gonorrhea is an STD that can affect both men and women.  This condition is caused by bacteria called Neisseria gonorrhoeae. The infection is spread from person to person, usually through sexual contact, including oral, anal, and vaginal sex.  Symptoms vary between males and females. Generally, they include abnormal discharge and burning during urination. Women may also experience painful sex, itching around the vagina, and bleeding between menstrual periods. Men may also experience swelling of the testicles.  This condition is treated with antibiotic medicines. Do not have sex until at least 7 days after completing antibiotic treatment.  If left untreated, gonorrhea can have serious side effects and complications. This information is not intended to replace advice given to you by your health care provider. Make sure you discuss any questions you have with your health care provider. Document Revised: 06/17/2018 Document Reviewed: 12/23/2015 Elsevier Patient Education  2020 Else[employer] Inc.  Chlamydia, Female  Chlamydia is a STD (sexually transmitted disease). This is an infection that spreads through sexual contact. If it is not treated, it can cause serious problems. It must be treated with antibiotic medicine. If this infection is not treated and you are pregnant or become pregnant, your baby could get it during delivery. This  may cause bad health problems for the baby. Sometimes, you may not have symptoms (asymptomatic). When you have symptoms, they can include:  Burning when you pee (urinate).  Peeing often.  Fluid (discharge) coming from the vagina.  Redness, soreness, and swelling (inflammation) of the  butt (rectum).  Bleeding or fluid coming from the butt.  Belly (abdominal) pain.  Pain during sex.  Bleeding between periods.  Itching, burning, or redness in the eyes.  Fluid coming from the eyes. Follow these instructions at home: Medicines  Take over-the-counter and prescription medicines only as told by your doctor.  Take your antibiotic medicine as told by your doctor. Do not stop taking the antibiotic even if you start to feel better. Sexual activity  Tell sex partners about your infection. Sex partners are people you had oral, anal, or vaginal sex with within 60 days of when you started getting sick. They need treatment, too.  Do not have sex until: ? You and your sex partners have been treated. ? Your doctor says it is okay.  If you have a single dose treatment, wait 7 days before having sex. General instructions  It is up to you to get your test results. Ask your doctor when your results will be ready.  Get a lot of rest.  Eat healthy foods.  Drink enough fluid to keep your pee (urine) clear or pale yellow.  Keep all follow-up visits as told by your doctor. You may need tests after 3 months. Preventing chlamydia  The only way to prevent chlamydia is not to have sex. To lower your risk: ? Use latex condoms correctly. Do this every time you have sex. ? Avoid having many sex partners. ? Ask if your partner has been tested for STDs and if he or she had negative results. Contact a doctor if:  You get new symptoms.  You do not get better with treatment.  You have a fever or chills.  You have pain during sex. Get help right away if:  Your pain gets worse and does not get  better with medicine.  You get flu-like symptoms, such as: ? Night sweats. ? Sore throat. ? Muscle aches.  You feel sick to your stomach (nauseous).  You throw up (vomit).  You have trouble swallowing.  You have bleeding: ? Between periods. ? After sex.  You have irregular periods.  You have belly pain that does not get better with medicine.  You have lower back pain that does not get better with medicine.  You feel weak or dizzy.  You pass out (faint).  You are pregnant and you get symptoms of chlamydia. Summary  Chlamydia is an infection that spreads through sexual contact.  Sometimes, chlamydia can cause no symptoms (asymptomatic).  Do not have sex until your doctor says it is okay.  All sex partners will have to be treated for chlamydia. This information is not intended to replace advice given to you by your health care provider. Make sure you discuss any questions you have with your health care provider. Document Revised: 07/16/2017 Document Reviewed: 01/12/2016 Elsevier Patient Education  2020 Elsevier Inc.    Vaginal Yeast Infection, Adult  Vaginal yeast infection is a condition that causes vaginal discharge as well as soreness, swelling, and redness (inflammation) of the vagina. This is a common condition. Some women get this infection frequently. What are the causes? This condition is caused by a change in the normal balance of the yeast (candida) and bacteria that live in the vagina. This change causes an overgrowth of yeast, which causes the inflammation. What increases the risk? The condition is more likely to develop in women who:  Take antibiotic medicines.  Have diabetes.  Take birth control pills.  Are pregnant.  Douche often.  Have a weak body defense system (immune system).  Have been taking steroid medicines for a long time.  Frequently wear tight clothing. What are the signs or symptoms? Symptoms of this condition include:   White, thick, creamy vaginal discharge.  Swelling, itching, redness, and irritation of the vagina. The lips of the vagina (vulva) may be affected as well.  Pain or a burning feeling while urinating.  Pain during sex. How is this diagnosed? This condition is diagnosed based on:  Your medical history.  A physical exam.  A pelvic exam. Your health care provider will examine a sample of your vaginal discharge under a microscope. Your health care provider may send this sample for testing to confirm the diagnosis. How is this treated? This condition is treated with medicine. Medicines may be over-the-counter or prescription. You may be told to use one or more of the following:  Medicine that is taken by mouth (orally).  Medicine that is applied as a cream (topically).  Medicine that is inserted directly into the vagina (suppository). Follow these instructions at home:  Lifestyle  Do not have sex until your health care provider approves. Tell your sex partner that you have a yeast infection. That person should go to his or her health care provider and ask if they should also be treated.  Do not wear tight clothes, such as pantyhose or tight pants.  Wear breathable cotton underwear. General instructions  Take or apply over-the-counter and prescription medicines only as told by your health care provider.  Eat more yogurt. This may help to keep your yeast infection from returning.  Do not use tampons until your health care provider approves.  Try taking a sitz bath to help with discomfort. This is a warm water bath that is taken while you are sitting down. The water should only come up to your hips and should cover your buttocks. Do this 3-4 times per day or as told by your health care provider.  Do not douche.  If you have diabetes, keep your blood sugar levels under control.  Keep all follow-up visits as told by your health care provider. This is important. Contact a health  care provider if:  You have a fever.  Your symptoms go away and then return.  Your symptoms do not get better with treatment.  Your symptoms get worse.  You have new symptoms.  You develop blisters in or around your vagina.  You have blood coming from your vagina and it is not your menstrual period.  You develop pain in your abdomen. Summary  Vaginal yeast infection is a condition that causes discharge as well as soreness, swelling, and redness (inflammation) of the vagina.  This condition is treated with medicine. Medicines may be over-the-counter or prescription.  Take or apply over-the-counter and prescription medicines only as told by your health care provider.  Do not douche. Do not have sex or use tampons until your health care provider approves.  Contact a health care provider if your symptoms do not get better with treatment or your symptoms go away and then return. This information is not intended to replace advice given to you by your health care provider. Make sure you discuss any questions you have with your health care provider. Document Revised: 08/22/2018 Document Reviewed: 06/10/2017 Elsevier Patient Education  2020 [employer]  Elsevier Patient Education  [employer] Inc[employer]

## 2019-02-26 NOTE — Progress Notes (Addendum)
rtual Visit via Video Note  I connected with@ on 02/26/19 at  9:30 AM EST by the video enabled telemedicine application, MyChart, and verified that I am speaking with the correct person using two identifiers.   I introduced myself as a Designer, jewellery with the practice. We discussed the limitations of evaluation and management by telemedicine and the availability of in person appointments. The patient expressed understanding and agreed to proceed.  The patient is: at home I am: in the office  Subjective:    CC: burning with urination and vaginal discharge  HPI: Amanda Simmons is a 24 y.o. y/o female presenting via [place] today for white vaginal discharge with a "yeasty" odor, dysuria, and urinary urgency for about 1.5 weeks. She also reports burning at the vaginal introitus. She reports she is currently sexually active in a monogamous relationship. She has been with this partner for the past three months. She denies a history or STD's for herself or her partner. She reports her partner does not have any symptoms and both have tested negative for STD's in the recent past. She denies increased odor after sex. She reports she and her partner do not use condoms. She denies pregnancy.   Sexual activity:  In a Monogamous Relationship Contraception: yes Recent unprotected intercourse: yes History of sexually transmitted diseases: no Previous sexually transmitted disease screening: yes Genital lesions: no Genital discharge: yes Dysuria: yes Swollen lymph nodes: no Fevers: no Rash: no   Past medical history, Surgical history, Family history not pertinant except as noted below, Social history, Allergies, and medications have been entered into the medical record, reviewed, and corrections made.   Review of Systems:  General: No fevers, chills, fatigue, night sweats, weight loss.   Neuro: No headache, numbness, paresthesias, or change in mental status  GI: No abdominal pain, nausea,  vomiting, diarrhea, changes in bowel habits, anorexia  GU: No hematuria, back pain, pelvic pain Endocrine: No lymphadenopathy Skin:No rash, color changes   Objective:    General: Speaking clearly in complete sentences without any shortness of breath.  Alert and oriented x3.  Normal judgment. No apparent acute distress.  Impression and Recommendations:    1. Suspected UTI Patient is unable to come in for UA, culture, or vaginal exam at this time. Empiric treatment based on symptoms, history, and recent sexual activity. Discussed with patient the possibility that these symptoms could be STI related and stressed the importance of avoiding sexual intercourse until treatment is complete for both she and her partner. Will treat with doxycycline to cover both UTI, chlamydia, and gonorrhea infections. Information provided on safe sex measures, UTI prevention and treatment, and STI prevention and treatment. Exam will be needed if symptoms worsen or fail to improve.  - doxycycline (VIBRA-TABS) 100 MG tablet; Take 1 tablet (100 mg total) by mouth 2 (two) times daily.  Dispense: 14 tablet; Refill: 0  2. Vaginal candidiasis Treatment provided for vaginal candidiasis symptoms and antibiotic use for UTI vs. STD. Patient unable to come in for UA, culture, or wet prep at this time and therefore is being treated empirically. Information provided on vaginal candidiasis prevention and treatment. Exam will be needed if symptoms worsen or fail to improve.  - fluconazole (DIFLUCAN) 150 MG tablet; Take 1 tablet (150 mg total) by mouth daily for 2 doses.  Dispense: 2 tablet; Refill: 0    I discussed the assessment and treatment plan with the patient. The patient was provided an opportunity to ask questions and all were  answered. The patient agreed with the plan and demonstrated an understanding of the instructions.   The patient was advised to call back or seek an in-person evaluation if the symptoms worsen or if the  condition fails to improve as anticipated.  25 minutes spent with chart review, paperwork, and patient visit.  Video Visit  Tollie Eth, NP

## 2019-03-03 ENCOUNTER — Telehealth: Payer: Self-pay

## 2019-03-03 DIAGNOSIS — R112 Nausea with vomiting, unspecified: Secondary | ICD-10-CM

## 2019-03-03 MED ORDER — ONDANSETRON HCL 4 MG PO TABS: 4.0000 mg | ORAL_TABLET | Freq: Three times a day (TID) | ORAL | 0 refills | Status: DC | PRN

## 2019-03-03 NOTE — Telephone Encounter (Signed)
Amanda Simmons states even if she eats she has nausea and vomiting with the doxycycline. She reports feeling some better but still reports a bad feeling while urinating. Please advise.

## 2019-03-03 NOTE — Telephone Encounter (Signed)
It is very important that she finish all of the antibiotic. She may still be having symptoms because she has not completed the course of medication, yet.  I have called in Zofran ([facility] [employer] [address]) that she can take to help with the nausea and vomiting. It may be best to take it about 30 minutes prior to taking the doxycycline so she can avoid the stomach upset all together. If she still has symptoms when the antibiotic is complete, please let her know she will need to come in so we can do a vaginal exam and test her urine.  -Thank you!

## 2019-03-04 NOTE — Telephone Encounter (Signed)
Patient advised of recommendations.  

## 2019-03-04 NOTE — Telephone Encounter (Signed)
Left a message for a return call.

## 2019-03-05 ENCOUNTER — Telehealth: Payer: Self-pay | Admitting: Nurse Practitioner

## 2019-03-05 ENCOUNTER — Ambulatory Visit (INDEPENDENT_AMBULATORY_CARE_PROVIDER_SITE_OTHER): Payer: 59 | Admitting: Nurse Practitioner

## 2019-03-05 ENCOUNTER — Other Ambulatory Visit: Payer: Self-pay

## 2019-03-05 ENCOUNTER — Encounter: Payer: Self-pay | Admitting: Nurse Practitioner

## 2019-03-05 VITALS — BP 116/77 | HR 84 | Temp 98.5°F | Ht 65.0 in | Wt 121.0 lb

## 2019-03-05 DIAGNOSIS — N898 Other specified noninflammatory disorders of vagina: Secondary | ICD-10-CM

## 2019-03-05 LAB — POCT URINALYSIS DIP (CLINITEK)
Bilirubin, UA: NEGATIVE
Glucose, UA: NEGATIVE mg/dL
Ketones, POC UA: NEGATIVE mg/dL
Leukocytes, UA: NEGATIVE
Nitrite, UA: NEGATIVE
POC PROTEIN,UA: NEGATIVE
Spec Grav, UA: >=1.03 — AB (ref 1.010–1.025)
Urobilinogen, UA: 1 E.U./dL
pH, UA: 6 (ref 5.0–8.0)

## 2019-03-05 LAB — POCT URINE PREGNANCY: Preg Test, Ur: NEGATIVE

## 2019-03-05 MED ORDER — BETAMETHASONE DIPROPIONATE 0.05 % EX CREA: TOPICAL_CREAM | Freq: Two times a day (BID) | CUTANEOUS | 1 refills | Status: DC

## 2019-03-05 MED ORDER — METRONIDAZOLE 500 MG PO TABS: 500.0000 mg | ORAL_TABLET | Freq: Two times a day (BID) | ORAL | 0 refills | Status: DC

## 2019-03-05 MED ORDER — LIDOCAINE 5 % EX OINT: 1.0000 "application " | TOPICAL_OINTMENT | Freq: Four times a day (QID) | CUTANEOUS | 1 refills | Status: DC | PRN

## 2019-03-05 NOTE — Progress Notes (Signed)
Acute Office Visit  Subjective:    Patient ID: Amanda Simmons, female    DOB: 02-27-1995, 24 y.o.   MRN: [id number]  CC:   HPI Patient is in today for follow-up from visit last week for symptoms of vaginal and urethral irritation with heavy vaginal discharge. She was treated empirically with doxycycline for 7 days for suspected STI infection.  Patient reports she experienced multiple episodes of nausea and vomiting while taking the medication and is unsure that she received all of the doses.  She is still experiencing symptoms.  She reports an intense burning sensation at the vaginal opening accompanied by thin, creamy white malodorous discharge.  She also reports her urine feels "hot".  She reports her pain at an 8/10 at its worst and is causing difficulty to even sit.  She has not had sexual intercourse for approximately 2 weeks.  She denies history of sexually transmitted diseases.  Her last STI check was negative.  She has an IUD placed and reports no problems with this.  Her current partner denies any symptoms.   Past Medical History:  Diagnosis Date  . ADD (attention deficit disorder)   . Anxiety   . Depression 08/16/2015   PHQ-9 was 11.      Past Surgical History:  Procedure Laterality Date  . MOUTH SURGERY      Family History  Problem Relation Age of Onset  . Diabetes Neg Hx   . Heart disease Neg Hx   . Hypertension Neg Hx   . Stroke Neg Hx   . Cancer Neg Hx     Social History   Socioeconomic History  . Marital status: Single    Spouse name: Not on file  . Number of children: Not on file  . Years of education: Not on file  . Highest education level: Not on file  Occupational History  . Occupation: unemployed  Tobacco Use  . Smoking status: Never Smoker  . Smokeless tobacco: Former Network engineer and Sexual Activity  . Alcohol use: No    Alcohol/week: 0.0 standard drinks  . Drug use: No  . Sexual activity: Yes    Partners: Male    Birth  control/protection: I.U.D.  Other Topics Concern  . Not on file  Social History Narrative  . Not on file   Social Determinants of Health   Financial Resource Strain:   . Difficulty of Paying Living Expenses: Not on file  Food Insecurity:   . Worried About Charity fundraiser in the Last Year: Not on file  . Ran Out of Food in the Last Year: Not on file  Transportation Needs:   . Lack of Transportation (Medical): Not on file  . Lack of Transportation (Non-Medical): Not on file  Physical Activity:   . Days of Exercise per Week: Not on file  . Minutes of Exercise per Session: Not on file  Stress:   . Feeling of Stress : Not on file  Social Connections:   . Frequency of Communication with Friends and Family: Not on file  . Frequency of Social Gatherings with Friends and Family: Not on file  . Attends Religious Services: Not on file  . Active Member of Clubs or Organizations: Not on file  . Attends Archivist Meetings: Not on file  . Marital Status: Not on file  Intimate Partner Violence:   . Fear of Current or Ex-Partner: Not on file  . Emotionally Abused: Not on file  . Physically Abused:  Not on file  . Sexually Abused: Not on file    Outpatient Medications Prior to Visit  Medication Sig Dispense Refill  . doxycycline (VIBRA-TABS) 100 MG tablet Take 1 tablet (100 mg total) by mouth 2 (two) times daily. 14 tablet 0  . ondansetron (ZOFRAN) 4 MG tablet Take 1 tablet (4 mg total) by mouth every 8 (eight) hours as needed for nausea or vomiting. May be taken 30-45 minutes prior to your antibiotic to help reduce nausea associated with the medication. 20 tablet 0   No facility-administered medications prior to visit.    Allergies  Allergen Reactions  . Effexor [Venlafaxine] Nausea And Vomiting  . Trintellix [Vortioxetine]     Made her mad/angry    Review of Systems  Constitutional: Negative for activity change, chills, fatigue and fever.  Gastrointestinal: Positive  for nausea and vomiting. Negative for abdominal distention, abdominal pain, constipation, diarrhea and rectal pain.  Genitourinary: Positive for dysuria, frequency, urgency, vaginal bleeding, vaginal discharge and vaginal pain. Negative for decreased urine volume, difficulty urinating, enuresis, flank pain, genital sores, hematuria, menstrual problem and pelvic pain.  Skin: Negative for rash and wound.  Neurological: Negative for headaches.       Objective:    Physical Exam Constitutional:      Appearance: Normal appearance. She is normal weight.  Cardiovascular:     Rate and Rhythm: Normal rate.     Pulses: Normal pulses.  Pulmonary:     Effort: Pulmonary effort is normal.  Abdominal:     General: Abdomen is flat. Bowel sounds are normal. There is no distension or abdominal bruit.     Palpations: Abdomen is soft. There is no shifting dullness, fluid wave, hepatomegaly, splenomegaly, mass or pulsatile mass.     Tenderness: There is no abdominal tenderness. There is no right CVA tenderness, left CVA tenderness, guarding or rebound.     Hernia: No hernia is present.  Genitourinary:    General: Normal vulva.     Exam position: Supine.     Labia:        Right: No rash, tenderness, lesion or injury.        Left: No rash, tenderness, lesion or injury.      Urethra: No prolapse, urethral swelling or urethral lesion.     Rectum: Normal.  Lymphadenopathy:     Lower Body: No right inguinal adenopathy. No left inguinal adenopathy.  Skin:    General: Skin is warm and dry.     Capillary Refill: Capillary refill takes less than 2 seconds.  Neurological:     General: No focal deficit present.     Mental Status: She is alert and oriented to person, place, and time.  Psychiatric:        Mood and Affect: Mood normal.        Behavior: Behavior normal.     There were no vitals taken for this visit. Wt Readings from Last 3 Encounters:  02/26/19 127 lb (57.6 kg)  08/11/18 127 lb (57.6 kg)   05/14/18 127 lb (57.6 kg)    Health Maintenance Due  Topic Date Due  . TETANUS/TDAP  04/08/2015  . CHLAMYDIA SCREENING  02/28/2017  . INFLUENZA VACCINE  09/06/2018    There are no preventive care reminders to display for this patient.   Lab Results  Component Value Date   TSH 1.39 06/03/2015   Lab Results  Component Value Date   WBC 13.3 (H) 04/15/2016   HGB 7.6 (L) 04/15/2016  HCT 24.4 (L) 04/15/2016   MCV 73.1 (L) 04/15/2016   PLT 213 04/15/2016   No results found for: NA, K, CHLORIDE, CO2, GLUCOSE, BUN, CREATININE, BILITOT, ALKPHOS, AST, ALT, PROT, ALBUMIN, CALCIUM, ANIONGAP, EGFR, GFR No results found for: CHOL No results found for: HDL No results found for: LDLCALC No results found for: TRIG No results found for: CHOLHDL No results found for: HGBA1C     Assessment & Plan:   1. Vaginal irritation Highly suspicious of bacterial vaginosis infection based on symptoms and presentation.  Point-of-care urinalysis performed with negative results.  Pregnancy test negative. Self swab wet prep obtained for trichomoniasis yeast and clue cells.  Vaginal discharge noted to be thin, white, milky.  No KOH available for whiff test.  Urine sent for gonorrhea/chlamydia testing.  Prescription for Flagyl provided for symptoms of bacterial vaginosis.  Discussed with the patient she cannot drink alcohol while taking this medication.  Stressed the importance of obtaining from sexual intercourse until test results have been obtained to ensure the partner testing does not need to be provided. Patient experiencing significant pain and discomfort at the vaginal introitus provided medications to help with this discomfort. Will notify patient of results.  Patient instructed to contact me if symptoms worsen or fail to improve. If all lab results are within normal limits no follow-up required.  Will need to bring patient back in for an office treatment if gonorrhea or chlamydia come back  positive. - POCT URINALYSIS DIP (CLINITEK) - POCT urine pregnancy - WET PREP FOR TRICH, YEAST, CLUE - metroNIDAZOLE (FLAGYL) 500 MG tablet; Take 1 tablet (500 mg total) by mouth 2 (two) times daily.  Dispense: 14 tablet; Refill: 0 - betamethasone dipropionate 0.05 % cream; Apply topically 2 (two) times daily. To affected area(s) as needed  Dispense: 15 g; Refill: 1 - lidocaine (XYLOCAINE) 5 % ointment; Apply 1 application topically 4 (four) times daily as needed. To affected area.  Dispense: 30 g; Refill: 1 - C. trachomatis/N. gonorrhoeae RNA  Return if symptoms worsen or fail to improve.  Orma Render, NP

## 2019-03-05 NOTE — Telephone Encounter (Signed)
Per Sarah patient needs in office appt for treatment.   Called patient and [facility] for her to call our office back. We can no longer treat this issue virtually.   Patient called back and appt moved to in person.

## 2019-03-05 NOTE — Patient Instructions (Signed)

## 2019-03-05 NOTE — Telephone Encounter (Signed)
Looks like patient scheduled a virtual appt with Maralyn Sago this afternoon.

## 2019-03-05 NOTE — Telephone Encounter (Signed)
PT called stating, "I keep throwing up the medication that was prescribed. Symptoms have not gotten better."  Talking to Amanda Simmons she advised me to tell the PT to come into the office to be evaluated.  She declined and demanded something to be sent in.  Please advise.

## 2019-03-06 ENCOUNTER — Encounter: Payer: Self-pay | Admitting: Nurse Practitioner

## 2019-03-06 LAB — C. TRACHOMATIS/N. GONORRHOEAE RNA
C. trachomatis RNA, TMA: NOT DETECTED
N. gonorrhoeae RNA, TMA: NOT DETECTED

## 2019-03-06 LAB — WET PREP FOR TRICH, YEAST, CLUE
MICRO NUMBER:: 10092434
Specimen Quality: ADEQUATE

## 2019-03-27 ENCOUNTER — Other Ambulatory Visit: Payer: Self-pay | Admitting: Nurse Practitioner

## 2019-03-27 ENCOUNTER — Encounter: Payer: Self-pay | Admitting: Nurse Practitioner

## 2019-03-27 ENCOUNTER — Telehealth: Payer: Self-pay | Admitting: Family Medicine

## 2019-03-27 MED ORDER — FLUCONAZOLE 200 MG PO TABS: 200.0000 mg | ORAL_TABLET | Freq: Once | ORAL | 0 refills | Status: AC

## 2019-03-27 NOTE — Telephone Encounter (Signed)
Patient is saying that she is having a yeast infection due to antibiotics and was wondering if medication can be sent in for this.

## 2019-03-30 ENCOUNTER — Other Ambulatory Visit: Payer: Self-pay

## 2019-03-30 ENCOUNTER — Ambulatory Visit (INDEPENDENT_AMBULATORY_CARE_PROVIDER_SITE_OTHER): Payer: 59 | Admitting: Physician Assistant

## 2019-03-30 VITALS — BP 116/77 | HR 86 | Ht 65.0 in | Wt 116.0 lb

## 2019-03-30 DIAGNOSIS — B3731 Acute candidiasis of vulva and vagina: Secondary | ICD-10-CM

## 2019-03-30 DIAGNOSIS — N898 Other specified noninflammatory disorders of vagina: Secondary | ICD-10-CM

## 2019-03-30 DIAGNOSIS — Z30011 Encounter for initial prescription of contraceptive pills: Secondary | ICD-10-CM

## 2019-03-30 DIAGNOSIS — B373 Candidiasis of vulva and vagina: Secondary | ICD-10-CM

## 2019-03-30 MED ORDER — FLUCONAZOLE 150 MG PO TABS: ORAL_TABLET | ORAL | 0 refills | Status: DC

## 2019-03-30 MED ORDER — NORETHIN ACE-ETH ESTRAD-FE 1-20 MG-MCG PO TABS: 1.0000 | ORAL_TABLET | Freq: Every day | ORAL | 11 refills | Status: DC

## 2019-03-30 NOTE — Progress Notes (Signed)
Subjective:    Patient ID: Clent Jacks, female    DOB: 12/04/1995, 24 y.o.   MRN: [id number]  HPI  Pt is a 24 yo female who presents to the clinic with persistent vaginal discharge and itching. She does have an IUD mirena that is supposed to be removed in April of this year. She noticed some increase of discharge with IUD but not like this. This discharge has been for the last 3-4 weeks. She has seen Jacolyn Reedy, NP twice since Jan. She has been treated for UTI, BV and yeast. She tested negative for GC/Chlamydia. She has gotten some better but as soon as she is treated for one thing another symptoms starts to occur. Only one sexual partner. She is not having a lot of itching and white thick discharge. No dysuria, fever, chills, abdominal pain, flank pain.   She does want IUD removed and transitioned to OCP.   .. Active Ambulatory Problems    Diagnosis Date Noted  . 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 Attention deficit hyperactivity disorder (ADHD) 01/17/2015  . Constipation 08/16/2015  . Depression 08/16/2015  . GAD (generalized anxiety disorder) 03/28/2017  . Low libido 03/28/2017  . Skin lesion 11/05/2017  . Other insomnia 11/05/2017  . OCD (obsessive compulsive disorder) 03/24/2018  . Vaginal candidiasis 03/31/2019   Resolved Ambulatory Problems    Diagnosis Date Noted  . Viral sinusitis 12/06/2014  . Need for meningococcal vaccination 12/06/2014  . Streptococcal sore throat 01/17/2015  . UTI (urinary tract infection) 06/08/2015  . Pregnancy 08/16/2015  . Nausea without vomiting 08/16/2015  . Supervision of normal first pregnancy 09/07/2015  . Depressed mood 09/22/2015  . Bereavement 09/22/2015  . Normal labor 04/14/2016  . Abscess 09/06/2016  . Poison ivy dermatitis 11/05/2017  . Current moderate episode of major depressive disorder without prior episode ([place]) 11/05/2017   Past Medical History:  Diagnosis Date  . ADD (attention  deficit disorder)   . Anxiety       Review of Systems See HPI.     Objective:   Physical Exam Vitals reviewed.  Constitutional:      Appearance: Normal appearance.  Cardiovascular:     Rate and Rhythm: Normal rate and regular rhythm.     Pulses: Normal pulses.  Pulmonary:     Effort: Pulmonary effort is normal.  Abdominal:     General: Bowel sounds are normal. There is no distension.     Palpations: Abdomen is soft.     Tenderness: There is no abdominal tenderness. There is no right CVA tenderness, left CVA tenderness, guarding or rebound.  Genitourinary:    Comments: Erythematous vulva. Speculum exam very painful with erythematous vaginal canal and thick white clumpy discharge covering cervix and os.  Discharge so thick not able to visualize strings of IUD and patient was in a lot of discomfort.  Neurological:     Mental Status: She is alert.  Psychiatric:        Mood and Affect: Mood normal.           Assessment & Plan:  Marland KitchenMarland KitchenApoorva was seen today for vaginal itching.  Diagnoses and all orders for this visit:  Vaginal candidiasis -     SureSwab, Vaginosis/Vaginitis Plus -     fluconazole (DIFLUCAN) 150 MG tablet; Take one tablet every 3 days x3 and then one tablet weekly for 4 weeks.  Vaginal discharge -     SureSwab, Vaginosis/Vaginitis Plus -     fluconazole (DIFLUCAN)  150 MG tablet; Take one tablet every 3 days x3 and then one tablet weekly for 4 weeks. -     TIQ-NTM  Vaginal itching -     SureSwab, Vaginosis/Vaginitis Plus -     fluconazole (DIFLUCAN) 150 MG tablet; Take one tablet every 3 days x3 and then one tablet weekly for 4 weeks. -     TIQ-NTM  Encounter for initial prescription of contraceptive pills -     norethindrone-ethinyl estradiol (JUNEL FE 1/20) 1-20 MG-MCG tablet; Take 1 tablet by mouth daily.   We are doing a vaginosis panel today to look for everything. PE looks like severe yeast infection. Treated with diflucan recurrent taper. Will  adjust plan as needed pending panel results. Speculum exam today was too painful. Will come back in 4 weeks to recheck and remove IUD. OCP sent to pharmacy. She does not need to start until IUD removed.   Spent 32 minutes with patient.

## 2019-03-30 NOTE — Patient Instructions (Addendum)

## 2019-03-31 ENCOUNTER — Encounter: Payer: Self-pay | Admitting: Physician Assistant

## 2019-03-31 DIAGNOSIS — B373 Candidiasis of vulva and vagina: Secondary | ICD-10-CM | POA: Insufficient documentation

## 2019-03-31 DIAGNOSIS — B3731 Acute candidiasis of vulva and vagina: Secondary | ICD-10-CM | POA: Insufficient documentation

## 2019-03-31 LAB — TIQ-NTM

## 2019-03-31 NOTE — Progress Notes (Signed)
This was for sure swab vaginosis/vaginitis panel.

## 2019-03-31 NOTE — Progress Notes (Signed)
I am pretty sure this was yeast. Pt can also just do the yeast treatment and then if not better reswab.

## 2019-04-02 LAB — SURESWAB, VAGINOSIS/VAGINITIS PLUS

## 2019-04-15 ENCOUNTER — Telehealth (INDEPENDENT_AMBULATORY_CARE_PROVIDER_SITE_OTHER): Payer: 59 | Admitting: Family Medicine

## 2019-04-15 ENCOUNTER — Encounter: Payer: Self-pay | Admitting: Family Medicine

## 2019-04-15 DIAGNOSIS — B9789 Other viral agents as the cause of diseases classified elsewhere: Secondary | ICD-10-CM | POA: Diagnosis not present

## 2019-04-15 DIAGNOSIS — J329 Chronic sinusitis, unspecified: Secondary | ICD-10-CM

## 2019-04-15 NOTE — Assessment & Plan Note (Signed)
Symptoms consistent with viral etiology.  Symptoms have nearly resolved as of today.    COVID less likely at this time but discussed if develops new or worsening symptoms to please let us know. She expresses understanding.  Continue supportive at home treatment.  Work note provided.  Follow up for new or worsening symptoms.

## 2019-04-15 NOTE — Progress Notes (Signed)
Amanda Simmons - 24 y.o. female MRN [id number]  Date of birth: [birth year]   This visit type was conducted due to national recommendations for restrictions regarding the COVID-19 Pandemic (e.g. social distancing).  This format is felt to be most appropriate for this patient at this time.  All issues noted in this document were discussed and addressed.  No physical exam was performed (except for noted visual exam findings with Video Visits).  I discussed the limitations of evaluation and management by telemedicine and the availability of in person appointments. The patient expressed understanding and agreed to proceed.  I connected with@ on 04/15/19 at  3:20 PM EST by a video enabled telemedicine application and verified that I am speaking with the correct person using two identifiers.  Present at visit: Everrett Coombe, DO [name]   Patient Location: Home [address] [place] [address]   Provider location:   [facility]  Chief Complaint  Patient presents with  . Sinus Problem    HPI  Amanda Simmons is a 24 y.o. female who presents via audio/video conferencing for a telehealth visit today.  She has complaint of 3-4 day history of sinus congestion.  States mild sinus congestion, improved today.  Mild cough initially but this has resolved.  Denies fever, chills, loss of taste/smell, chest pain, shortness of breath, headache or nausea, vomiting, diarrhea.  She has had good response to Dayquil.  She denies sick contacts.    ROS:  A comprehensive ROS was completed and negative except as noted per HPI  Past Medical History:  Diagnosis Date  . ADD (attention deficit disorder)   . Anxiety   . Depression 08/16/2015   PHQ-9 was 11.      Past Surgical History:  Procedure Laterality Date  . MOUTH SURGERY      Family History  Problem Relation Age of Onset  . Diabetes Neg Hx   . Heart disease Neg Hx   . Hypertension Neg Hx   . Stroke Neg Hx   . Cancer Neg Hx      Social History   Socioeconomic History  . Marital status: Single    Spouse name: Not on file  . Number of children: Not on file  . Years of education: Not on file  . Highest education level: Not on file  Occupational History  . Occupation: unemployed  Tobacco Use  . Smoking status: Never Smoker  . Smokeless tobacco: Former Engineer, water and Sexual Activity  . Alcohol use: No    Alcohol/week: 0.0 standard drinks  . Drug use: No  . Sexual activity: Yes    Partners: Male    Birth control/protection: I.U.D.  Other Topics Concern  . Not on file  Social History Narrative  . Not on file   Social Determinants of Health   Financial Resource Strain:   . Difficulty of Paying Living Expenses: Not on file  Food Insecurity:   . Worried About Programme researcher, broadcasting/film/video in the Last Year: Not on file  . Ran Out of Food in the Last Year: Not on file  Transportation Needs:   . Lack of Transportation (Medical): Not on file  . Lack of Transportation (Non-Medical): Not on file  Physical Activity:   . Days of Exercise per Week: Not on file  . Minutes of Exercise per Session: Not on file  Stress:   . Feeling of Stress : Not on file  Social Connections:   . Frequency of Communication  with Friends and Family: Not on file  . Frequency of Social Gatherings with Friends and Family: Not on file  . Attends Religious Services: Not on file  . Active Member of Clubs or Organizations: Not on file  . Attends Archivist Meetings: Not on file  . Marital Status: Not on file  Intimate Partner Violence:   . Fear of Current or Ex-Partner: Not on file  . Emotionally Abused: Not on file  . Physically Abused: Not on file  . Sexually Abused: Not on file     Current Outpatient Medications:  .  norethindrone-ethinyl estradiol (JUNEL FE 1/20) 1-20 MG-MCG tablet, Take 1 tablet by mouth daily., Disp: 1 Package, Rfl: 11  EXAM:  VITALS per patient if applicable: Temp 98 F (98.3 C) (Oral)   Ht 5'  5" (1.651 m)   Wt 121 lb (54.9 kg)   BMI 20.14 kg/m   GENERAL: alert, oriented, appears well and in no acute distress  HEENT: atraumatic, conjunttiva clear, no obvious abnormalities on inspection of external nose and ears  NECK: normal movements of the head and neck  LUNGS: on inspection no signs of respiratory distress, breathing rate appears normal, no obvious gross SOB, gasping or wheezing  CV: no obvious cyanosis  MS: moves all visible extremities without noticeable abnormality  PSYCH/NEURO: pleasant and cooperative, no obvious depression or anxiety, speech and thought processing grossly intact  ASSESSMENT AND PLAN:  Discussed the following assessment and plan:  Viral sinusitis Symptoms consistent with viral etiology.  Symptoms have nearly resolved as of today.    COVID less likely at this time but discussed if develops new or worsening symptoms to please let us know. She expresses understanding.  Continue supportive at home treatment.  Work note provided.  Follow up for new or worsening symptoms.   20 minutes spent including pre visit preparation, review of prior notes and labs, encounter with patient via video visit and same day documentation.    I discussed the assessment and treatment plan with the patient. The patient was provided an opportunity to ask questions and all were answered. The patient agreed with the plan and demonstrated an understanding of the instructions.   The patient was advised to call back or seek an in-person evaluation if the symptoms worsen or if the condition fails to improve as anticipated.    Luetta Nutting, DO

## 2019-04-15 NOTE — Progress Notes (Signed)
Sinus pressure Some cough on and off, not productive   Not able to be at work, Needs a work note.   No fever, chills  X 2-3 days.   Taken Dayquil. Some relief.  Needs work note since 04/14/2019 and 04/15/2019.  Doxemity--per patient, [phone].

## 2019-04-28 ENCOUNTER — Telehealth: Payer: Self-pay

## 2019-04-28 MED ORDER — METRONIDAZOLE 500 MG PO TABS: 500.0000 mg | ORAL_TABLET | Freq: Two times a day (BID) | ORAL | 0 refills | Status: DC

## 2019-04-28 NOTE — Telephone Encounter (Signed)
Leiani called and left a message stating, "I have vaginal discharge and since I have them over and over again I need Jade to send something to the pharmacy. I work everyday and I can't come in all the time. I also hate the smell and the feeling of the infection." Please advise.

## 2019-04-28 NOTE — Telephone Encounter (Signed)
I will send over due to history metronidazole SHE needs to use boric acid suppositories 3 times a week for 4 weeks then as needed for her discharge. It will help a lot.

## 2019-04-29 ENCOUNTER — Ambulatory Visit (INDEPENDENT_AMBULATORY_CARE_PROVIDER_SITE_OTHER): Payer: 59 | Admitting: Physician Assistant

## 2019-04-29 ENCOUNTER — Other Ambulatory Visit: Payer: Self-pay

## 2019-04-29 ENCOUNTER — Telehealth: Payer: Self-pay | Admitting: Neurology

## 2019-04-29 VITALS — BP 107/68 | HR 78 | Ht 65.0 in | Wt 121.0 lb

## 2019-04-29 DIAGNOSIS — N760 Acute vaginitis: Secondary | ICD-10-CM | POA: Diagnosis not present

## 2019-04-29 DIAGNOSIS — B9689 Other specified bacterial agents as the cause of diseases classified elsewhere: Secondary | ICD-10-CM

## 2019-04-29 DIAGNOSIS — B852 Pediculosis, unspecified: Secondary | ICD-10-CM

## 2019-04-29 DIAGNOSIS — N6310 Unspecified lump in the right breast, unspecified quadrant: Secondary | ICD-10-CM | POA: Diagnosis not present

## 2019-04-29 MED ORDER — IVERMECTIN 0.5 % EX LOTN: TOPICAL_LOTION | CUTANEOUS | 1 refills | Status: DC

## 2019-04-29 NOTE — Telephone Encounter (Signed)
Tried to call patient back, no answer, busy tone.

## 2019-04-29 NOTE — Patient Instructions (Addendum)
Will get breast ultrasound.  Consider boric acid.    Lice, Adult     Lice are tiny insects with claws on the ends of their legs. They are small parasites that live on the human body. A parasite is an insect that lives off another animal and cannot survive without it. Lice often make their home in a person's hair, such as hair on the head or in the pubic area. Pubic lice are sometimes referred to as crabs. Lice hatch from little round eggs, which are attached to the base of hairs. Lice eggs are also called nits. Lice can spread from one person to another. Lice crawl. They do not fly or jump. Lice cause skin irritation and itching in the area of the infested hair. Although having lice can be annoying, it is not dangerous. Lice do not spread diseases. Treatment will usually clear up the symptoms within a few days. What are the causes? This condition may be caused by:  Having very close contact with an infested person.  Sharing infested items that touch your skin and hair. These include personal items, such as hats, combs, brushes, towels, clothing, pillowcases, or sheets. Pubic lice are spread through sexual contact. What increases the risk? Although having lice is more common among young children, anyone can get lice. Lice tend to thrive in warm weather, so that type of weather increases the risk. What are the signs or symptoms? Symptoms of this condition include:  Itchiness in the affected area.  Skin irritation.  Feeling of something moving in the hair.  Rash or sores on the skin.  Tiny flakes or sacs near the scalp. These may be white, yellow, or tan.  Tiny bugs crawling on the hair or scalp. How is this diagnosed? This condition is diagnosed based on:  Your symptoms.  A physical exam. ? Your health care provider will examine the affected area closely for live lice, tiny eggs (nits), and empty egg cases. ? Eggs are typically yellow or tan in color. Empty egg cases are  whitish. Lice are gray or brown. How is this treated? Treatment for this condition includes:  Using a hair rinse that contains a mild insecticide to kill lice. Your health care provider will recommend a prescription or over-the-counter rinse.  Removing lice, eggs, and egg cases by using a comb or tweezers.  Washing and bagging your clothing and bedding. Pregnant women should not use medicated shampoo or cream without first talking to their health care provider. Follow these instructions at home: Using medicated rinse Apply medicated rinse as told by your health care provider. Follow the label instructions carefully. General instructions for applying rinses may include these steps: 1. Put on an old shirt or underwear, or use an old towel in case of staining from the rinse. 2. Wash and towel-dry your head or pubic area before applying the rinse if directed to do so. 3. When your hair is dry, apply the rinse. Leave the rinse in your hair for the amount of time specified in the instructions. 4. Rinse the area with water. 5. Comb your wet hair with a fine-tooth comb. Comb it close to the skin and down to the ends, removing any lice, eggs, or egg cases. A lice comb may be included with the medicated rinse. 6. Do not wash the infested hair for 2 days while the medicine kills the lice. 7. After the treatment, repeat combing out your hair and removing lice, eggs, or egg cases from the hair every  2-3 days. Do this for about 2-3 weeks. After treatment, the remaining lice should be moving more slowly. 8. Repeat the treatment if necessary in 7-10 days. General instructions  Remove any remaining lice, eggs, or egg cases using a fine-tooth comb.  Use hot water to wash all towels, hats, scarves, jackets, bedding, and clothing that you have recently used.  Put any non-washable items that may have been exposed into plastic bags. Keep the bags closed for 2 weeks.  Soak all combs and brushes in hot water  for 10 minutes.  Vacuum furniture to remove any loose hair. There is no need to use chemicals, which can be poisonous (toxic). Lice survive for only 1-2 days away from human skin. Eggs may survive for only 1 week.  For pubic lice, tell any sexual partners to seek treatment.  For head lice, ask your health care provider if other family members or close contacts should be examined or treated as well.  Keep all follow-up visits as told by your health care provider. This is important. Contact a health care provider if:  You develop sores that look infected.  Your rash or sores do not go away in 1 week.  The lice or eggs return or do not go away in spite of treatment. Summary  Lice are tiny parasitic insects that live on the human body. A parasite is an insect that lives off another animal and cannot survive without it.  Lice can spread from one person to another through close contact with an infested person or by sharing personal items, such as combs, brushes, or hats.  Lice can be treated with a medicated rinse. Follow your health care provider's instructions, or instructions on the label, if you are being treated with this medicine.  Ask your health care provider if your family members or close contacts should be treated for lice. This information is not intended to replace advice given to you by your health care provider. Make sure you discuss any questions you have with your health care provider. Document Revised: 07/04/2017 Document Reviewed: 02/01/2016 Elsevier Patient Education  2020 [employer].

## 2019-04-29 NOTE — Telephone Encounter (Signed)
Patient states lotion is over $200, is there an alternative?

## 2019-04-29 NOTE — Telephone Encounter (Signed)
Patient states she is coming in today.

## 2019-04-29 NOTE — Progress Notes (Signed)
Subjective:    Patient ID: Amanda Simmons, female    DOB: Jan 29, 1996, 24 y.o.   MRN: [id number]  HPI  Patient is a 24 year old female who presents to the clinic with multiple concerns today.  Patient's daughter and home his wife and now she has it as well.  They have used 1 treatment of permethrin but they still have active lice and eggs.  She would like something else to treat.  She is also noticed a right breast mass.  It is tender to touch.  She has noticed it for the last week or so.  She denies any family history of breast cancer.  She denies any nipple changes or discharge.  Patient has recurrent bacterial vaginosis and she presents with vaginal discharge and odor.  She had actually called in before via MyChart and metronidazole was already sent to the pharmacy.  She comes here to discuss prevention.  .. Active Ambulatory Problems    Diagnosis Date Noted  . 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 Viral sinusitis 12/06/2014  . Attention deficit hyperactivity disorder (ADHD) 01/17/2015  . Constipation 08/16/2015  . Depression 08/16/2015  . GAD (generalized anxiety disorder) 03/28/2017  . Low libido 03/28/2017  . Skin lesion 11/05/2017  . Other insomnia 11/05/2017  . OCD (obsessive compulsive disorder) 03/24/2018  . Vaginal candidiasis 03/31/2019  . Lice 76/19/5093  . Breast mass 05/01/2019  . BV (bacterial vaginosis) 05/01/2019   Resolved Ambulatory Problems    Diagnosis Date Noted  . Need for meningococcal vaccination 12/06/2014  . Streptococcal sore throat 01/17/2015  . UTI (urinary tract infection) 06/08/2015  . Pregnancy 08/16/2015  . Nausea without vomiting 08/16/2015  . Supervision of normal first pregnancy 09/07/2015  . Depressed mood 09/22/2015  . Bereavement 09/22/2015  . Normal labor 04/14/2016  . Abscess 09/06/2016  . Poison ivy dermatitis 11/05/2017  . Current moderate episode of major depressive disorder without prior  episode ([place]) 11/05/2017   Past Medical History:  Diagnosis Date  . ADD (attention deficit disorder)   . Anxiety       Review of Systems See HPI.     Objective:   Physical Exam Vitals reviewed.  Constitutional:      Appearance: Normal appearance.  HENT:     Head:     Comments: Active lice and nits present on scalp.  Cardiovascular:     Rate and Rhythm: Normal rate.  Pulmonary:     Effort: Pulmonary effort is normal.  Chest:    Abdominal:     General: Bowel sounds are normal. There is no distension.     Palpations: Abdomen is soft.     Tenderness: There is no abdominal tenderness. There is no guarding.  Neurological:     General: No focal deficit present.     Mental Status: She is alert and oriented to person, place, and time.  Psychiatric:        Mood and Affect: Mood normal.           Assessment & Plan:  Marland KitchenMarland KitchenLatese was seen today for head lice and breast problem.  Diagnoses and all orders for this visit:  Lice -     Ivermectin 0.5 % LOTN; Apply to the hair and scalp as directed. Leave in hair for 10 minutes before rinsing. Repeat in 10 days.  Breast mass, right -     US BREAST LTD UNI RIGHT INC AXILLA  BV (bacterial vaginosis)   Patient has already used over-the-counter permethrin.  I went ahead and sent over ivermectin to use once now and then 7 to 10 days later.  Given handout and encourage patient to get a small cone to come out some of the nits.  Patient aware is extremely important for her to also wash bed sheets etc.  Unclear etiology of small breast mass.  Illness feels like a pebble like calcium deposit.  We will get ultrasound.  No signs of infection. No family hx of breast cancer.   Patient with a history of bacterial vaginosis.  She was sent metronidazole for 7 days.  Discussed prevention with boric acid suppositories.  Handout given.

## 2019-04-29 NOTE — Telephone Encounter (Signed)
The only thing cheaper is permethrin but you can get that same strength OTC. You can call pharmacy to make sure benzyl alcohol topical is not cheaper or spinosad. If one of those is cheaper can send that.

## 2019-04-30 NOTE — Telephone Encounter (Signed)
Left message on machine for patient letting her know information below and to call back with any questions.

## 2019-05-01 ENCOUNTER — Ambulatory Visit: Payer: 59 | Admitting: Physician Assistant

## 2019-05-01 ENCOUNTER — Encounter: Payer: Self-pay | Admitting: Physician Assistant

## 2019-05-01 ENCOUNTER — Other Ambulatory Visit: Payer: Self-pay

## 2019-05-01 ENCOUNTER — Ambulatory Visit (INDEPENDENT_AMBULATORY_CARE_PROVIDER_SITE_OTHER): Payer: 59 | Admitting: Physician Assistant

## 2019-05-01 ENCOUNTER — Ambulatory Visit (INDEPENDENT_AMBULATORY_CARE_PROVIDER_SITE_OTHER): Payer: 59

## 2019-05-01 VITALS — BP 119/60 | HR 74

## 2019-05-01 DIAGNOSIS — B9689 Other specified bacterial agents as the cause of diseases classified elsewhere: Secondary | ICD-10-CM | POA: Insufficient documentation

## 2019-05-01 DIAGNOSIS — T8332XA Displacement of intrauterine contraceptive device, initial encounter: Secondary | ICD-10-CM

## 2019-05-01 DIAGNOSIS — N63 Unspecified lump in unspecified breast: Secondary | ICD-10-CM | POA: Insufficient documentation

## 2019-05-01 DIAGNOSIS — B852 Pediculosis, unspecified: Secondary | ICD-10-CM | POA: Insufficient documentation

## 2019-05-01 DIAGNOSIS — N760 Acute vaginitis: Secondary | ICD-10-CM | POA: Insufficient documentation

## 2019-05-01 NOTE — Progress Notes (Signed)
   Subjective:    Patient ID: Amanda Simmons, female    DOB: 12-02-1995, 24 y.o.   MRN: [id number]  HPI Pt is a 24 yo female who presents to the clinic to have IUD removed.   .. Active Ambulatory Problems    Diagnosis Date Noted  . 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 Viral sinusitis 12/06/2014  . Attention deficit hyperactivity disorder (ADHD) 01/17/2015  . Constipation 08/16/2015  . Depression 08/16/2015  . GAD (generalized anxiety disorder) 03/28/2017  . Low libido 03/28/2017  . Skin lesion 11/05/2017  . Other insomnia 11/05/2017  . OCD (obsessive compulsive disorder) 03/24/2018  . Vaginal candidiasis 03/31/2019  . Lice 05/01/2019  . Breast mass 05/01/2019  . BV (bacterial vaginosis) 05/01/2019   Resolved Ambulatory Problems    Diagnosis Date Noted  . Need for meningococcal vaccination 12/06/2014  . Streptococcal sore throat 01/17/2015  . UTI (urinary tract infection) 06/08/2015  . Pregnancy 08/16/2015  . Nausea without vomiting 08/16/2015  . Supervision of normal first pregnancy 09/07/2015  . Depressed mood 09/22/2015  . Bereavement 09/22/2015  . Normal labor 04/14/2016  . Abscess 09/06/2016  . Poison ivy dermatitis 11/05/2017  . Current moderate episode of major depressive disorder without prior episode ([facility]) 11/05/2017   Past Medical History:  Diagnosis Date  . ADD (attention deficit disorder)   . Anxiety       Review of Systems  All other systems reviewed and are negative.      Objective:   Physical Exam Vitals reviewed.  Constitutional:      Appearance: Normal appearance.  Cardiovascular:     Rate and Rhythm: Normal rate and regular rhythm.  Abdominal:     General: Bowel sounds are normal. There is no distension.     Palpations: Abdomen is soft.     Tenderness: There is no abdominal tenderness. There is no guarding.  Genitourinary:    General: Normal vulva.     Comments: Cervical os friable.  No strings  present.  Copious green to clear discharge.  Neurological:     Mental Status: She is alert and oriented to person, place, and time.           Assessment & Plan:  Marland KitchenMarland KitchenTerri was seen today for contraception.  Diagnoses and all orders for this visit:  Intrauterine contraceptive device threads lost, initial encounter -     DG Abd 1 View -     Ambulatory referral to Obstetrics / Gynecology   Being treated for BV. No strings seen. Xray done and confirmed in uterus. Will send to GYN to be removed.   Not able to afford Lice treatment from Wednesday. Discussed she could use OTC then. If insurance does pay I will send over 3 second line but call insurance and let me know.

## 2019-05-01 NOTE — Patient Instructions (Signed)
You will need to contact insurance to see what is covered:  Permethrin topical 1% Pyrethrum/piperonyl butoxide topical Benzyl alcohol 5% Ivermectin topical 0.5% spinosad topical 0.9%

## 2019-05-01 NOTE — Progress Notes (Signed)
l °

## 2019-05-05 ENCOUNTER — Inpatient Hospital Stay: Admission: RE | Admit: 2019-05-05 | Payer: 59 | Source: Ambulatory Visit

## 2019-05-14 ENCOUNTER — Ambulatory Visit (INDEPENDENT_AMBULATORY_CARE_PROVIDER_SITE_OTHER): Payer: 59 | Admitting: Obstetrics and Gynecology

## 2019-05-14 ENCOUNTER — Encounter: Payer: Self-pay | Admitting: Obstetrics and Gynecology

## 2019-05-14 ENCOUNTER — Other Ambulatory Visit: Payer: Self-pay

## 2019-05-14 VITALS — BP 114/66 | HR 77 | Temp 98.4°F | Ht 66.0 in | Wt 118.0 lb

## 2019-05-14 DIAGNOSIS — Z30432 Encounter for removal of intrauterine contraceptive device: Secondary | ICD-10-CM

## 2019-05-14 NOTE — Progress Notes (Signed)
24 yo P1 here for IUD removal. IUD has been in place since 2018. Patient had attempted IUD removal with PCP but strings not visualized. Patient plans to start birth control pills and already has a prescription. Patient is without any other complaints  Past Medical History:  Diagnosis Date  . ADD (attention deficit disorder)   . Anxiety   . Depression 08/16/2015   PHQ-9 was 11.     Past Surgical History:  Procedure Laterality Date  . MOUTH SURGERY     Family History  Problem Relation Age of Onset  . Diabetes Neg Hx   . Heart disease Neg Hx   . Hypertension Neg Hx   . Stroke Neg Hx   . Cancer Neg Hx    Social History   Tobacco Use  . Smoking status: Never Smoker  . Smokeless tobacco: Former Engineer, water Use Topics  . Alcohol use: No    Alcohol/week: 0.0 standard drinks  . Drug use: No   ROS See pertinent in HPI. All other symptoms reviewed and negative Blood pressure 114/66, pulse 77, temperature 98.4 F (36.9 C), height 5\' 6"  (1.676 m), weight 118 lb (53.5 kg). GENERAL: Well-developed, well-nourished female in no acute distress.  \ABDOMEN: Soft, nontender, nondistended. No organomegaly. PELVIC: Normal external female genitalia. Vagina is pink and rugated.  Normal discharge. Normal appearing cervix. IUD strings not visualized. Uterus is normal in size.  No adnexal mass or tenderness. EXTREMITIES: No cyanosis, clubbing, or edema, 2+ distal pulses.  A/P 24 yo here for IUD removal Patient identified, informed consent performed, consent signed.  Patient was in the dorsal lithotomy position, normal external genitalia was noted.  A speculum was placed in the patient's vagina, normal discharge was noted, no lesions. The cervix was visualized, no lesions, no abnormal discharge. The strings of the IUD were not visualized, so Kelly forceps were introduced into the endometrial cavity and the IUD was grasped and removed in its entirety.  Patient tolerated the procedure well.     Patient will use COC for contraception.  Routine preventative health maintenance measures emphasized.

## 2019-05-28 ENCOUNTER — Ambulatory Visit: Payer: 59 | Admitting: Obstetrics and Gynecology

## 2019-05-28 ENCOUNTER — Encounter: Payer: Self-pay | Admitting: Family Medicine

## 2019-05-28 ENCOUNTER — Telehealth (INDEPENDENT_AMBULATORY_CARE_PROVIDER_SITE_OTHER): Payer: 59 | Admitting: Family Medicine

## 2019-05-28 DIAGNOSIS — L237 Allergic contact dermatitis due to plants, except food: Secondary | ICD-10-CM

## 2019-05-28 DIAGNOSIS — D509 Iron deficiency anemia, unspecified: Secondary | ICD-10-CM | POA: Diagnosis not present

## 2019-05-28 MED ORDER — PREDNISONE 10 MG PO TABS: ORAL_TABLET | ORAL | 0 refills | Status: DC

## 2019-05-28 NOTE — Progress Notes (Signed)
Amanda Simmons - 24 y.o. female MRN [id number]  Date of birth: 1995/05/14   This visit type was conducted due to national recommendations for restrictions regarding the COVID-19 Pandemic (e.g. social distancing).  This format is felt to be most appropriate for this patient at this time.  All issues noted in this document were discussed and addressed.  No physical exam was performed (except for noted visual exam findings with Video Visits).  I discussed the limitations of evaluation and management by telemedicine and the availability of in person appointments. The patient expressed understanding and agreed to proceed.  I connected with@ on 05/28/19 at  3:20 PM EDT by a video enabled telemedicine application and verified that I am speaking with the correct person using two identifiers.  Present at visit: Luetta Nutting, DO Clent Jacks   Patient Location: Home [place] [address]   Provider location:   [facility]  Chief Complaint  Patient presents with  . poision oak    Arm and thighs,     HPI  Amanda Simmons is a 24 y.o. female who presents via audio/video conferencing for a telehealth visit today.  She has complaint of rash today.  Rash is located on arms, legs and trunk.  First noticed yesterday.  Areas are very itchy.  Similar to when she has had poison ivy/oak in the past.  There ares some small blisters with the rash with some clear fluid.   She has tried calamine without much relief.  She denies fever, chills, or pain with rash.    She also requests to have updated CBC and ferritin due to history of iron deficiency anemia.    ROS:  A comprehensive ROS was completed and negative except as noted per HPI  Past Medical History:  Diagnosis Date  . ADD (attention deficit disorder)   . Anxiety   . Depression 08/16/2015   PHQ-9 was 11.      Past Surgical History:  Procedure Laterality Date  . MOUTH SURGERY      Family History  Problem Relation  Age of Onset  . Diabetes Neg Hx   . Heart disease Neg Hx   . Hypertension Neg Hx   . Stroke Neg Hx   . Cancer Neg Hx     Social History   Socioeconomic History  . Marital status: Single    Spouse name: Not on file  . Number of children: Not on file  . Years of education: Not on file  . Highest education level: Not on file  Occupational History  . Occupation: unemployed  Tobacco Use  . Smoking status: Never Smoker  . Smokeless tobacco: Former Network engineer and Sexual Activity  . Alcohol use: No    Alcohol/week: 0.0 standard drinks  . Drug use: No  . Sexual activity: Yes    Partners: Male    Birth control/protection: I.U.D.  Other Topics Concern  . Not on file  Social History Narrative  . Not on file   Social Determinants of Health   Financial Resource Strain:   . Difficulty of Paying Living Expenses:   Food Insecurity:   . Worried About Charity fundraiser in the Last Year:   . Arboriculturist in the Last Year:   Transportation Needs:   . Film/video editor (Medical):   Marland Kitchen Lack of Transportation (Non-Medical):   Physical Activity:   . Days of Exercise per Week:   . Minutes of Exercise  per Session:   Stress:   . Feeling of Stress :   Social Connections:   . Frequency of Communication with Friends and Family:   . Frequency of Social Gatherings with Friends and Family:   . Attends Religious Services:   . Active Member of Clubs or Organizations:   . Attends Banker Meetings:   Marland Kitchen Marital Status:   Intimate Partner Violence:   . Fear of Current or Ex-Partner:   . Emotionally Abused:   Marland Kitchen Physically Abused:   . Sexually Abused:      Current Outpatient Medications:  .  predniSONE (DELTASONE) 10 MG tablet, Take 40mg  x3 days, 30mg  x3 days, 20mg  x3 days, 10mg  x3 days, 5mg  x3 days, Disp: 32 tablet, Rfl: 0  EXAM:  VITALS per patient if applicable: There were no vitals taken for this visit.  GENERAL: alert, oriented, appears well and in no acute  distress  HEENT: atraumatic, conjunttiva clear, no obvious abnormalities on inspection of external nose and ears  NECK: normal movements of the head and neck  LUNGS: on inspection no signs of respiratory distress, breathing rate appears normal, no obvious gross SOB, gasping or wheezing  CV: no obvious cyanosis  Skin:  Scattered, raised appearing patches in linear pattern. Some small vesicles noted.   MS: moves all visible extremities without noticeable abnormality  PSYCH/NEURO: pleasant and cooperative, no obvious depression or anxiety, speech and thought processing grossly intact  ASSESSMENT AND PLAN:  Discussed the following assessment and plan:  Poison ivy dermatitis Start prednisone taper, will do prolonged taper to prevent rebound reaction.  She may continue calamine as needed.  Call for new or worsening symptoms.   Iron deficiency anemia Update CBC and ferritin levels. Orders faxed to lab.   30 minutes spent including pre visit preparation, review of prior notes and labs, encounter with patient via video visit and same day documentation.    I discussed the assessment and treatment plan with the patient. The patient was provided an opportunity to ask questions and all were answered. The patient agreed with the plan and demonstrated an understanding of the instructions.   The patient was advised to call back or seek an in-person evaluation if the symptoms worsen or if the condition fails to improve as anticipated.    , DO

## 2019-05-28 NOTE — Assessment & Plan Note (Signed)
Update CBC and ferritin levels. Orders faxed to lab.

## 2019-05-28 NOTE — Progress Notes (Signed)
Called patient and it went to VM. I let patient know that she could log into her mychart and the provider would talk with her to get more information. (05/28/19).    She reports that she is having some red bumps, and looks like blister.  She has not been outside.   She reports her dog was outside and that is the only thing that she can remember.   She wants labs.

## 2019-05-28 NOTE — Assessment & Plan Note (Signed)
Start prednisone taper, will do prolonged taper to prevent rebound reaction.  She may continue calamine as needed.  Call for new or worsening symptoms.

## 2019-06-23 ENCOUNTER — Telehealth (INDEPENDENT_AMBULATORY_CARE_PROVIDER_SITE_OTHER): Payer: 59 | Admitting: Physician Assistant

## 2019-06-23 VITALS — Ht 66.0 in | Wt 118.0 lb

## 2019-06-23 DIAGNOSIS — R112 Nausea with vomiting, unspecified: Secondary | ICD-10-CM

## 2019-06-23 DIAGNOSIS — N926 Irregular menstruation, unspecified: Secondary | ICD-10-CM | POA: Diagnosis not present

## 2019-06-23 MED ORDER — ONDANSETRON HCL 8 MG PO TABS: 8.0000 mg | ORAL_TABLET | Freq: Three times a day (TID) | ORAL | 0 refills | Status: DC | PRN

## 2019-06-23 NOTE — Progress Notes (Signed)
Started feeling bad about 5 am this morning when she woke up for work Vomiting (twice this morning, none since) No diarrhea/ some constipation (normal for her) Sweats/ no chills/ no fever (doesn't feel like she has fever, no thermometer to check) Sore throat (from vomiting, just started after throwing up)

## 2019-06-23 NOTE — Progress Notes (Signed)
Patient ID: Amanda Simmons, female   DOB: 11-May-1995, 24 y.o.   MRN: [id number] .Marland KitchenVirtual Visit via Video Note  I connected with Amanda Simmons on 06/23/2019 at  2:40 PM EDT by a video enabled telemedicine application and verified that I am speaking with the correct person using two identifiers.  Location: Patient: home Provider: home   I discussed the limitations of evaluation and management by telemedicine and the availability of in person appointments. The patient expressed understanding and agreed to proceed.  History of Present Illness: Pt is a 24 yo female who calls into the clinic after waking up at 5am this morning feeling nauseated and vomiting. She has had some loose stools. She vomited twice but not just feels more nauseated. She is feeling some better. Pt denies any chills, headaches, loss of smell or taste, SOB, cough. Her throat started hurting a little after her 2 episodes of vomiting. She has not taken anything to make better. She did miss work and needs note. She does mention missing her period this month. She took home pregnancy test was negative. She is taking OCP>    Active Ambulatory Problems    Diagnosis Date Noted  . 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 Viral sinusitis 12/06/2014  . Attention deficit hyperactivity disorder (ADHD) 01/17/2015  . Constipation 08/16/2015  . Depression 08/16/2015  . GAD (generalized anxiety disorder) 03/28/2017  . Low libido 03/28/2017  . Skin lesion 11/05/2017  . Poison ivy dermatitis 11/05/2017  . Other insomnia 11/05/2017  . OCD (obsessive compulsive disorder) 03/24/2018  . Vaginal candidiasis 03/31/2019  . Lice 54/27/0623  . Breast mass 05/01/2019  . BV (bacterial vaginosis) 05/01/2019  . Iron deficiency anemia 05/28/2019   Resolved Ambulatory Problems    Diagnosis Date Noted  . Need for meningococcal vaccination 12/06/2014  . Streptococcal sore throat 01/17/2015  . UTI (urinary tract  infection) 06/08/2015  . Pregnancy 08/16/2015  . Nausea without vomiting 08/16/2015  . Supervision of normal first pregnancy 09/07/2015  . Depressed mood 09/22/2015  . Bereavement 09/22/2015  . Normal labor 04/14/2016  . Abscess 09/06/2016  . Current moderate episode of major depressive disorder without prior episode ([place]) 11/05/2017   Past Medical History:  Diagnosis Date  . ADD (attention deficit disorder)   . Anxiety    Reviewed med, allergy, problem list.   Observations/Objective: No acute distress Pale appearance No coughing or labored breathing.   .. Today's Vitals   06/23/19 1329  Weight: 118 lb (53.5 kg)  Height: 5\' 6"  (1.676 m)   Body mass index is 19.05 kg/m.    Assessment and Plan: Marland KitchenMarland KitchenCaeleigh was seen today for emesis.  Diagnoses and all orders for this visit:  Nausea and vomiting, intractability of vomiting not specified, unspecified vomiting type -     ondansetron (ZOFRAN) 8 MG tablet; Take 1 tablet (8 mg total) by mouth every 8 (eight) hours as needed for nausea or vomiting.  Missed period   Unclear etiology could be virus since already feeling better. zofran for nausea. Stay hydrated and keep BRAT diet. Written out for work for today and tomorrow.  Wait 2 to 3 days and recheck home pregnancy test. If any new or worsening symptoms please reach back out.   [email or web address] to send letter.   Follow Up Instructions:    I discussed the assessment and treatment plan with the patient. The patient was provided an opportunity to ask questions and all were answered. The patient agreed with the plan and  demonstrated an understanding of the instructions.   The patient was advised to call back or seek an in-person evaluation if the symptoms worsen or if the condition fails to improve as anticipated.  I provided 12 minutes of non-face-to-face time during this encounter.   Tandy Gaw, PA-C

## 2019-06-24 ENCOUNTER — Encounter: Payer: Self-pay | Admitting: Physician Assistant

## 2019-06-26 ENCOUNTER — Telehealth: Payer: Self-pay

## 2019-06-26 ENCOUNTER — Other Ambulatory Visit: Payer: Self-pay

## 2019-06-26 ENCOUNTER — Telehealth (INDEPENDENT_AMBULATORY_CARE_PROVIDER_SITE_OTHER): Payer: 59 | Admitting: Family Medicine

## 2019-06-26 ENCOUNTER — Encounter: Payer: Self-pay | Admitting: Family Medicine

## 2019-06-26 VITALS — Temp 97.2°F | Ht 66.0 in | Wt 118.0 lb

## 2019-06-26 DIAGNOSIS — R112 Nausea with vomiting, unspecified: Secondary | ICD-10-CM | POA: Diagnosis not present

## 2019-06-26 DIAGNOSIS — J020 Streptococcal pharyngitis: Secondary | ICD-10-CM

## 2019-06-26 MED ORDER — PENICILLIN V POTASSIUM 500 MG PO TABS: 500.0000 mg | ORAL_TABLET | Freq: Two times a day (BID) | ORAL | 0 refills | Status: DC

## 2019-06-26 NOTE — Progress Notes (Signed)
Virtual Visit via Telephone Note  I connected with Amanda Simmons on 06/26/19 at  4:20 PM EDT by a video enabled telemedicine application and verified that I am speaking with the correct person using two identifiers.   I discussed the limitations of evaluation and management by telemedicine and the availability of in person appointments. The patient expressed understanding and agreed to proceed.  Patient was at home [place] in the office  Subjective:    CC: ST   HPI: ST Started 2-3 days ago: Sore throat Dry cough  Nasal congestion Has history of tonsil stones.   Vomited 3 x yesterday and felt really bad. Went to get COVID tested yesterday and per her report it was negative.  She has had some swollen lymph nodes under her jawline more so on the right side compared to her left.   Has been taking dayquil/nyquil/OTC nasal spray Covid test at [facility] - negative (yesterday) Daughter tested positive for Strep throat today at pediatrician   Past medical history, Surgical history, Family history not pertinant except as noted below, Social history, Allergies, and medications have been entered into the medical record, reviewed, and corrections made.   Review of Systems: No fevers, chills, night sweats, weight loss, chest pain, or shortness of breath.   Objective:    General: Speaking clearly in complete sentences without any shortness of breath.  Alert and oriented x3.  Normal judgment. No apparent acute distress.    Impression and Recommendations:    No problem-specific Assessment & Plan notes found for this encounter.  Strep throat -possibly strep throat as she has other symptoms including nasal congestion and slight cough which would be less likely with strep throat but her daughter did test positive today and her daughter was actually sick first.  We will go ahead and treat with penicillin if not responding pretty briskly over the weekend then please give [place] call back.  Again she did  test negative for Covid with a rapid test to consider repeat testing if needed.  They did recently travel to the beach.  Continue symptomatic care.  Vomiting-says she usually does get nauseated but not vomiting so unclear if she may have had some type of gastroenteritis.  Again if not improving consider repeat COVID testing. She didn't vomit today.    Time spent in encounter 20 minutes  I  .discussed the assessment and treatment plan with the patient. The patient was provided an opportunity to ask questions d all were answered. The patient agreed with the plan and demonstrated an understanding of the instructions.   The patient was advised to call back or seek an in-person evaluation if the symptoms worsen or if the condition fails to improve as anticipated.   Nani Gasser, MD

## 2019-06-26 NOTE — Telephone Encounter (Signed)
Amanda Simmons had a virtual visit this week with Amanda Simmons. Her daughter has now tested positive for strep throat. She reports having a sore throat. She states she did go get tested for COVID as advised and it was negative. We do not have any openings. Please advise.

## 2019-06-26 NOTE — Telephone Encounter (Signed)
Called pt and scheduled pt

## 2019-06-26 NOTE — Progress Notes (Signed)
Started 2-3 days ago: Sore throat Dry cough  congestion Has history of tonsil stones  Has been taking dayquil/nyquil/OTC nasal spray Covid test at [facility] - negative (yesterday) Daughter tested positive for Strep throat today at pediatrician

## 2019-06-26 NOTE — Telephone Encounter (Signed)
Please call patient and see if I can call her at 5:00 for a quick telephone/virtual call so that we can take care of her strep throat.  Keita double book at 5:00.

## 2019-11-10 ENCOUNTER — Encounter: Payer: Self-pay | Admitting: Emergency Medicine

## 2019-11-10 ENCOUNTER — Other Ambulatory Visit: Payer: Self-pay

## 2019-11-10 ENCOUNTER — Emergency Department (INDEPENDENT_AMBULATORY_CARE_PROVIDER_SITE_OTHER)
Admission: EM | Admit: 2019-11-10 | Discharge: 2019-11-10 | Disposition: A | Discharge status: Home / Self Care | Payer: 59 | Source: Home / Self Care

## 2019-11-10 DIAGNOSIS — R300 Dysuria: Secondary | ICD-10-CM

## 2019-11-10 DIAGNOSIS — N3001 Acute cystitis with hematuria: Secondary | ICD-10-CM

## 2019-11-10 DIAGNOSIS — N760 Acute vaginitis: Secondary | ICD-10-CM

## 2019-11-10 LAB — POCT URINALYSIS DIP (MANUAL ENTRY)
Bilirubin, UA: NEGATIVE
Glucose, UA: NEGATIVE mg/dL
Ketones, POC UA: NEGATIVE mg/dL
Nitrite, UA: NEGATIVE
Protein Ur, POC: 100 mg/dL — AB
Spec Grav, UA: 1.025 (ref 1.010–1.025)
Urobilinogen, UA: 0.2 E.U./dL
pH, UA: 6.5 (ref 5.0–8.0)

## 2019-11-10 MED ORDER — NITROFURANTOIN MONOHYD MACRO 100 MG PO CAPS: 100.0000 mg | ORAL_CAPSULE | Freq: Two times a day (BID) | ORAL | 0 refills | Status: AC

## 2019-11-10 MED ORDER — FLUCONAZOLE 150 MG PO TABS: 150.0000 mg | ORAL_TABLET | Freq: Once | ORAL | 0 refills | Status: AC

## 2019-11-10 NOTE — ED Provider Notes (Signed)
[facility]    CSN: [id number] Arrival date & time: 11/10/19  1637      History   Chief Complaint Chief Complaint  Patient presents with  . Dysuria    HPI Amanda Simmons is a 24 y.o. female.   HPI  Patient presents with dysuria, hematuria, frequency x1 day.  Patient endorses history of recurrent UTIs. Denies nausea, vomiting, or abdominal pain. She endorses mild vaginal irritation and has history of yeast infection. Declines concern for STD. Patient's last menstrual period was 10/27/2019 (approximate). Patient prescribed OCP. Past Medical History:  Diagnosis Date  . ADD (attention deficit disorder)   . Anxiety   . Depression 08/16/2015   PHQ-9 was 11.      Patient Active Problem List   Diagnosis Date Noted  . Iron deficiency anemia 05/28/2019  . Lice 05/01/2019  . Breast mass 05/01/2019  . BV (bacterial vaginosis) 05/01/2019  . Vaginal candidiasis 03/31/2019  . OCD (obsessive compulsive disorder) 03/24/2018  . Skin lesion 11/05/2017  . Poison ivy dermatitis 11/05/2017  . Other insomnia 11/05/2017  . GAD (generalized anxiety disorder) 03/28/2017  . Low libido 03/28/2017  . Constipation 08/16/2015  . Depression 08/16/2015  . Attention deficit hyperactivity disorder (ADHD) 01/17/2015  . 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 Viral sinusitis 12/06/2014    Past Surgical History:  Procedure Laterality Date  . MOUTH SURGERY      OB History    Gravida  1   Para  1   Term  1   Preterm      AB      Living  1     SAB      TAB      Ectopic      Multiple  0   Live Births  1            Home Medications    Prior to Admission medications   Medication Sig Start Date End Date Taking? Authorizing Provider  JUNEL FE 1/20 1-20 MG-MCG tablet Take 1 tablet by mouth daily. 05/29/19   [provider]    Family History Family History  Problem Relation Age of Onset  . Healthy Mother   . Healthy  Father   . Diabetes Neg Hx   . Heart disease Neg Hx   . Hypertension Neg Hx   . Stroke Neg Hx   . Cancer Neg Hx     Social History Social History   Tobacco Use  . Smoking status: Never Smoker  . Smokeless tobacco: Former Clinical biochemist  . Vaping Use: Every day  . Substances: Nicotine, Flavoring  Substance Use Topics  . Alcohol use: Yes    Alcohol/week: 0.0 standard drinks  . Drug use: No     Allergies   Effexor [venlafaxine] and Trintellix [vortioxetine]   Review of Systems Review of Systems Pertinent negatives listed in HPI   Physical Exam Triage Vital Signs ED Triage Vitals [11/10/19 1705]  Enc Vitals Group     BP 116/71     Pulse Rate (!) 106     Resp      Temp 99.3 F (37.4 C)     Temp Source Oral     SpO2 98 %     Weight 120 lb (54.4 kg)     Height 5\' 6"  (1.676 m)     Head Circumference      Peak Flow      Pain Score 7  Pain Loc      Pain Edu?      Excl. in GC?    No data found.  Updated Vital Signs BP 116/71 (BP Location: Right Arm)   Pulse (!) 106   Temp 99.3 F (37.4 C) (Oral)   Ht 5\' 6"  (1.676 m)   Wt 120 lb (54.4 kg)   LMP 10/27/2019 (Approximate)   SpO2 98%   BMI 19.37 kg/m   Visual Acuity Right Eye Distance:   Left Eye Distance:   Bilateral Distance:    Right Eye Near:   Left Eye Near:    Bilateral Near:     Physical Exam General appearance: alert, well developed, well nourished, cooperative  Head: Normocephalic, without obvious abnormality, atraumatic Respiratory: Respirations even and unlabored, normal respiratory rate Heart: rate and rhythm normal. No gallop or murmurs noted on exam  Abdomen: BS +, no distention, no rebound tenderness, or no CVA tenderness  Skin: Skin color, texture, turgor normal. No rashes seen  Psych: Appropriate mood and affect.  UC Treatments / Results  Labs (all labs ordered are listed, but only abnormal results are displayed) Labs Reviewed  POCT URINALYSIS DIP (MANUAL ENTRY) -  Abnormal; Notable for the following components:      Result Value   Color, UA brown (*)    Clarity, UA cloudy (*)    Blood, UA large (*)    Protein Ur, POC =100 (*)    Leukocytes, UA Large (3+) (*)    All other components within normal limits    EKG   Radiology No results found.  Procedures Procedures (including critical care time)  Medications Ordered in UC Medications - No data to display  Initial Impression / Assessment and Plan / UC Course  I have reviewed the triage vital signs and the nursing notes.  Pertinent labs & imaging results that were available during my care of the patient were reviewed by me and considered in my medical decision making (see chart for details).    UA and symptoms consistent with UTI. Urine culture pending.  Start Macrobid for UTI. Diflucan prescribed for vaginitis . Red flags discussed. AVS printed. Final Clinical Impressions(s) / UC Diagnoses   Final diagnoses:  Dysuria  Acute cystitis with hematuria  Vaginitis and vulvovaginitis   Discharge Instructions   None    ED Prescriptions    Medication Sig Dispense Auth. Provider   nitrofurantoin, macrocrystal-monohydrate, (MACROBID) 100 MG capsule Take 1 capsule (100 mg total) by mouth 2 (two) times daily for 10 days. 20 capsule 10/29/2019, FNP   fluconazole (DIFLUCAN) 150 MG tablet Take 1 tablet (150 mg total) by mouth once for 1 dose. Repeat if needed 2 tablet Bing Neighbors, FNP     PDMP not reviewed this encounter.   Bing Neighbors, FNP 11/12/19 2325

## 2019-11-10 NOTE — ED Triage Notes (Signed)
Dysuria started today 

## 2019-11-13 LAB — URINE CULTURE
MICRO NUMBER:: 11035584
SPECIMEN QUALITY:: ADEQUATE

## 2019-12-25 ENCOUNTER — Ambulatory Visit (INDEPENDENT_AMBULATORY_CARE_PROVIDER_SITE_OTHER): Payer: 59 | Admitting: Physician Assistant

## 2019-12-25 ENCOUNTER — Other Ambulatory Visit: Payer: Self-pay

## 2019-12-25 ENCOUNTER — Encounter: Payer: Self-pay | Admitting: Physician Assistant

## 2019-12-25 VITALS — BP 128/67 | HR 93 | Ht 66.0 in | Wt 118.0 lb

## 2019-12-25 DIAGNOSIS — R1012 Left upper quadrant pain: Secondary | ICD-10-CM | POA: Diagnosis not present

## 2019-12-25 DIAGNOSIS — N926 Irregular menstruation, unspecified: Secondary | ICD-10-CM

## 2019-12-25 DIAGNOSIS — R102 Pelvic and perineal pain: Secondary | ICD-10-CM

## 2019-12-25 DIAGNOSIS — R110 Nausea: Secondary | ICD-10-CM

## 2019-12-25 LAB — POCT URINE PREGNANCY: Preg Test, Ur: NEGATIVE

## 2019-12-25 MED ORDER — OMEPRAZOLE 40 MG PO CPDR: 40.0000 mg | DELAYED_RELEASE_CAPSULE | Freq: Every day | ORAL | 1 refills | Status: DC

## 2019-12-25 NOTE — Progress Notes (Signed)
Subjective:    Patient ID: Amanda Simmons, female    DOB: [birth year], 24 y.o.   MRN: [id number]  HPI  Pt is a 24 yo female who presents to the clinic with missed period, abdominal pain and nausea concerns.   She has had a positive home test but then the next 3 were negative. She is nauseated but not vomiting. She stopped her birth control a while back.   No diarrhea or constipation. She is sexually active. No fever, chills, body aches, headaches, flank pain. She has having some lower abdominal cramping and some left upper quadrant discomfort. Not related to eating.   .. Active Ambulatory Problems    Diagnosis Date Noted  . 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 Viral sinusitis 12/06/2014  . Attention deficit hyperactivity disorder (ADHD) 01/17/2015  . Constipation 08/16/2015  . Depression 08/16/2015  . GAD (generalized anxiety disorder) 03/28/2017  . Low libido 03/28/2017  . Skin lesion 11/05/2017  . Poison ivy dermatitis 11/05/2017  . Other insomnia 11/05/2017  . OCD (obsessive compulsive disorder) 03/24/2018  . Vaginal candidiasis 03/31/2019  . Lice 05/01/2019  . Breast mass 05/01/2019  . BV (bacterial vaginosis) 05/01/2019  . Iron deficiency anemia 05/28/2019   Resolved Ambulatory Problems    Diagnosis Date Noted  . Need for meningococcal vaccination 12/06/2014  . Streptococcal sore throat 01/17/2015  . UTI (urinary tract infection) 06/08/2015  . Pregnancy 08/16/2015  . Nausea without vomiting 08/16/2015  . Supervision of normal first pregnancy 09/07/2015  . Depressed mood 09/22/2015  . Bereavement 09/22/2015  . Normal labor 04/14/2016  . Abscess 09/06/2016  . Current moderate episode of major depressive disorder without prior episode ([facility]) 11/05/2017   Past Medical History:  Diagnosis Date  . ADD (attention deficit disorder)   . Anxiety         Review of Systems See HPI.     Objective:   Physical Exam Vitals reviewed.   Constitutional:      Appearance: Normal appearance.  Cardiovascular:     Rate and Rhythm: Normal rate and regular rhythm.     Pulses: Normal pulses.     Heart sounds: Normal heart sounds.  Pulmonary:     Effort: Pulmonary effort is normal.  Abdominal:     General: Bowel sounds are normal. There is no distension.     Palpations: Abdomen is soft.     Tenderness: There is no right CVA tenderness, left CVA tenderness, guarding or rebound.     Comments: Diffuse tenderness in suprapubic area and left upper quadrant.   Musculoskeletal:     Right lower leg: No edema.     Left lower leg: No edema.  Neurological:     General: No focal deficit present.     Mental Status: She is alert and oriented to person, place, and time.  Psychiatric:        Mood and Affect: Mood normal.        Behavior: Behavior normal.           Assessment & Plan:  Marland KitchenMarland KitchenTyreisha was seen today for menstrual problem.  Diagnoses and all orders for this visit:  Missed period -     POCT urine pregnancy -     hCG, serum, qualitative -     C. trachomatis/N. gonorrhoeae RNA -     WET PREP FOR TRICH, YEAST, CLUE -     Urine Culture  Suprapubic pain -     hCG, serum, qualitative -  COMPLETE METABOLIC PANEL WITH GFR -     CBC with Differential/Platelet -     Lipase -     C. trachomatis/N. gonorrhoeae RNA -     WET PREP FOR TRICH, YEAST, CLUE -     Urine Culture -     SPECIMEN COMPROMISED  Nausea -     hCG, serum, qualitative -     COMPLETE METABOLIC PANEL WITH GFR -     CBC with Differential/Platelet -     Lipase -     omeprazole (PRILOSEC) 40 MG capsule; Take 1 capsule (40 mg total) by mouth daily. -     C. trachomatis/N. gonorrhoeae RNA -     WET PREP FOR TRICH, YEAST, CLUE -     Urine Culture -     SPECIMEN COMPROMISED  Left upper quadrant pain -     COMPLETE METABOLIC PANEL WITH GFR -     CBC with Differential/Platelet -     Lipase -     omeprazole (PRILOSEC) 40 MG capsule; Take 1 capsule (40 mg  total) by mouth daily. -     C. trachomatis/N. gonorrhoeae RNA -     WET PREP FOR TRICH, YEAST, CLUE -     Urine Culture -     SPECIMEN COMPROMISED   WET PREP STAT negative for BV/yeast/trich STI testing pending Urine culture  Labs for cbc/cmp/lipase ordered.  UPT negative. Confirm with serum HCG.  Trial of omeprazole for reflux.   If no reason for pain ok for pelvic ultrasound.  Consider ibS for cramping and abdominal pain.

## 2019-12-25 NOTE — Patient Instructions (Signed)
Start omeprazole to see if there is some gastritis/acid reflux component to your nausea and upper abdominal pain. Follow up in 2 weeks.

## 2019-12-26 LAB — CBC WITH DIFFERENTIAL/PLATELET
Absolute Monocytes: 360 cells/uL (ref 200–950)
Basophils Absolute: 21 cells/uL (ref 0–200)
Basophils Relative: 0.4 %
Eosinophils Absolute: 11 cells/uL — ABNORMAL LOW (ref 15–500)
Eosinophils Relative: 0.2 %
HCT: 40.1 % (ref 35.0–45.0)
Hemoglobin: 13.8 g/dL (ref 11.7–15.5)
Lymphs Abs: 1442 cells/uL (ref 850–3900)
MCH: 30.8 pg (ref 27.0–33.0)
MCHC: 34.4 g/dL (ref 32.0–36.0)
MCV: 89.5 fL (ref 80.0–100.0)
MPV: 10.9 fL (ref 7.5–12.5)
Monocytes Relative: 6.8 %
Neutro Abs: 3466 cells/uL (ref 1500–7800)
Neutrophils Relative %: 65.4 %
Platelets: 242 10*3/uL (ref 140–400)
RBC: 4.48 10*6/uL (ref 3.80–5.10)
RDW: 11.6 % (ref 11.0–15.0)
Total Lymphocyte: 27.2 %
WBC: 5.3 10*3/uL (ref 3.8–10.8)

## 2019-12-26 LAB — COMPLETE METABOLIC PANEL WITH GFR
AG Ratio: 1.6 (calc) (ref 1.0–2.5)
ALT: 7 U/L (ref 6–29)
AST: 13 U/L (ref 10–30)
Albumin: 4.5 g/dL (ref 3.6–5.1)
Alkaline phosphatase (APISO): 47 U/L (ref 31–125)
BUN: 10 mg/dL (ref 7–25)
CO2: 25 mmol/L (ref 20–32)
Calcium: 9.4 mg/dL (ref 8.6–10.2)
Chloride: 105 mmol/L (ref 98–110)
Creat: 0.7 mg/dL (ref 0.50–1.10)
GFR, Est African American: 142 mL/min/{1.73_m2} (ref >=60)
GFR, Est Non African American: 122 mL/min/{1.73_m2} (ref >=60)
Globulin: 2.9 g/dL (calc) (ref 1.9–3.7)
Glucose, Bld: 63 mg/dL — ABNORMAL LOW (ref 65–99)
Potassium: 3.7 mmol/L (ref 3.5–5.3)
Sodium: 141 mmol/L (ref 135–146)
Total Bilirubin: 0.8 mg/dL (ref 0.2–1.2)
Total Protein: 7.4 g/dL (ref 6.1–8.1)

## 2019-12-26 LAB — LIPASE: Lipase: 25 U/L (ref 7–60)

## 2019-12-26 LAB — SPECIMEN COMPROMISED

## 2019-12-26 LAB — HCG, SERUM, QUALITATIVE: Preg, Serum: NEGATIVE

## 2019-12-28 LAB — URINE CULTURE
MICRO NUMBER:: 11229189
SPECIMEN QUALITY:: ADEQUATE

## 2019-12-28 LAB — WET PREP FOR TRICH, YEAST, CLUE
MICRO NUMBER:: 11229188
Specimen Quality: ADEQUATE

## 2019-12-28 LAB — C. TRACHOMATIS/N. GONORRHOEAE RNA
C. trachomatis RNA, TMA: NOT DETECTED
N. gonorrhoeae RNA, TMA: NOT DETECTED

## 2019-12-28 NOTE — Progress Notes (Signed)
Negative for STD as well. I do think moving forward with pelvic ultrasound is a good idea.

## 2019-12-28 NOTE — Progress Notes (Signed)
Wet prep today did not show any trich/yeast/or bacterial vaginosis.   Pancreas, liver, kidney all look good.

## 2020-01-07 ENCOUNTER — Telehealth (INDEPENDENT_AMBULATORY_CARE_PROVIDER_SITE_OTHER): Payer: 59 | Admitting: Nurse Practitioner

## 2020-01-07 ENCOUNTER — Encounter: Payer: Self-pay | Admitting: Nurse Practitioner

## 2020-01-07 DIAGNOSIS — R1114 Bilious vomiting: Secondary | ICD-10-CM

## 2020-01-07 DIAGNOSIS — R6889 Other general symptoms and signs: Secondary | ICD-10-CM

## 2020-01-07 MED ORDER — ONDANSETRON 8 MG PO TBDP: 8.0000 mg | ORAL_TABLET | Freq: Three times a day (TID) | ORAL | 2 refills | Status: DC | PRN

## 2020-01-07 MED ORDER — PROMETHAZINE HCL 25 MG PO TABS: ORAL_TABLET | ORAL | 2 refills | Status: DC

## 2020-01-07 NOTE — Progress Notes (Signed)
Patient very ill based on triage note- did not log-in for virtual visit or answer phone. VM left and [facility] message sent to patient. Patient encouraged to go to ED or UC if symptoms continue and she is unable to keep liquids down. Rx sent for phenergan and zofran based on triage notes.

## 2020-01-08 ENCOUNTER — Encounter: Payer: Self-pay | Admitting: Physician Assistant

## 2020-01-08 ENCOUNTER — Telehealth (INDEPENDENT_AMBULATORY_CARE_PROVIDER_SITE_OTHER): Payer: 59 | Admitting: Physician Assistant

## 2020-01-08 VITALS — Ht 66.0 in | Wt 118.0 lb

## 2020-01-08 DIAGNOSIS — R109 Unspecified abdominal pain: Secondary | ICD-10-CM

## 2020-01-08 DIAGNOSIS — A084 Viral intestinal infection, unspecified: Secondary | ICD-10-CM

## 2020-01-08 DIAGNOSIS — K589 Irritable bowel syndrome without diarrhea: Secondary | ICD-10-CM

## 2020-01-08 NOTE — Progress Notes (Deleted)
Started yesterday Nausea/vomiting Diarrhea Hot/cold sweats  Headache today, but no vomiting Nausea Abdominal pain  Has not had the ultrasound done  No menstrual cycle since she was seen  3am started   No contact  addi doctor ear drops.   Write Monday out of work.- release go back .

## 2020-01-11 MED ORDER — DICYCLOMINE HCL 10 MG PO CAPS: 10.0000 mg | ORAL_CAPSULE | Freq: Two times a day (BID) | ORAL | 1 refills | Status: DC

## 2020-01-11 NOTE — Progress Notes (Signed)
Patient ID: Amanda Simmons, female   DOB: May 02, 1995, 24 y.o.   MRN: [id number] .Marland KitchenVirtual Visit via Telephone Note  I connected with Amanda Simmons on 01/08/2020 at  3:40 PM EST by telephone and verified that I am speaking with the correct person using two identifiers.  Location: Patient: home Provider: clinic  .Marland KitchenParticipating in visit:  Patient: Amanda Simmons Provider: Tandy Gaw PA-C Provider in training: Elizabeth Sauer PA-Student    I discussed the limitations, risks, security and privacy concerns of performing an evaluation and management service by telephone and the availability of in person appointments. I also discussed with the patient that there may be a patient responsible charge related to this service. The patient expressed understanding and agreed to proceed.   History of Present Illness: Patient is a 24 year old female who has a history of lower abdominal pain and cramping who presents clinic with symptoms that started yesterday at about 3 AM of nausea, vomiting, diarrhea, chills, sweats.  Amanda Simmons responded on my chart and suggested some flulike symptoms.  She does feel much better today.  She does have a mild headache but no vomiting.  She does have some abdominal cramping.  She never any fever.  Her daughter was having similar symptoms and took her to the doctor today.  Gave her some eardrops nothing else was done.  Patient has had no other sick contacts.  She denies any loss of smell or taste, shortness of breath, cough, body aches.  Patient did not have ultrasound done for follow-up on last visit. All labs were normal.   .. Active Ambulatory Problems    Diagnosis Date Noted  . 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 Viral sinusitis 12/06/2014  . Attention deficit hyperactivity disorder (ADHD) 01/17/2015  . Constipation 08/16/2015  . Depression 08/16/2015  . GAD (generalized anxiety disorder) 03/28/2017  . Low libido 03/28/2017  .  Skin lesion 11/05/2017  . Poison ivy dermatitis 11/05/2017  . Other insomnia 11/05/2017  . OCD (obsessive compulsive disorder) 03/24/2018  . Vaginal candidiasis 03/31/2019  . Lice 05/01/2019  . Breast mass 05/01/2019  . BV (bacterial vaginosis) 05/01/2019  . Iron deficiency anemia 05/28/2019   Resolved Ambulatory Problems    Diagnosis Date Noted  . Need for meningococcal vaccination 12/06/2014  . Streptococcal sore throat 01/17/2015  . UTI (urinary tract infection) 06/08/2015  . Pregnancy 08/16/2015  . Nausea without vomiting 08/16/2015  . Supervision of normal first pregnancy 09/07/2015  . Depressed mood 09/22/2015  . Bereavement 09/22/2015  . Normal labor 04/14/2016  . Abscess 09/06/2016  . Current moderate episode of major depressive disorder without prior episode (HCC) 11/05/2017   Past Medical History:  Diagnosis Date  . ADD (attention deficit disorder)   . Anxiety    Reviewed med, allergy, problem list.   Observations/Objective: No acute distress Normal mood.  No problems breathing.   .. Today's Vitals   01/08/20 1526  Weight: 118 lb (53.5 kg)  Height: 5\' 6"  (1.676 m)   Body mass index is 19.05 kg/m.    Assessment and Plan: Marland KitchenXyla was seen today for nausea and abdominal pain.  Diagnoses and all orders for this visit:  Viral gastroenteritis  Irritable bowel syndrome without diarrhea -     dicyclomine (BENTYL) 10 MG capsule; Take 1 capsule (10 mg total) by mouth in the morning and at bedtime.  Abdominal cramping   Patient symptoms sound more like viral gastroenteritis with the short duration.  Encouraged her to come get Covid tested  to be sure.  She could not get in today.  We will watchfully wait over the weekend.  Written out of work until Monday.  If symptoms are not resolving or she has any new symptoms she needs to be Covid tested before going back to work.  Continue to use Zofran and Phenergan that were sent to the pharmacy yesterday.  Rest and  hydrate as well as keep to a brat diet.  I certainly still feel like there is some overlying IBS symptoms that are contributing to her ongoing abdominal cramping.  Sent over some Bentyl to try twice a day for the symptoms.   Follow Up Instructions:    I discussed the assessment and treatment plan with the patient. The patient was provided an opportunity to ask questions and all were answered. The patient agreed with the plan and demonstrated an understanding of the instructions.   The patient was advised to call back or seek an in-person evaluation if the symptoms worsen or if the condition fails to improve as anticipated.  I provided 15 minutes of non-face-to-face time during this encounter.   Tandy Gaw, PA-C

## 2020-01-30 ENCOUNTER — Other Ambulatory Visit: Payer: Self-pay | Admitting: Physician Assistant

## 2020-01-30 DIAGNOSIS — R1012 Left upper quadrant pain: Secondary | ICD-10-CM

## 2020-01-30 DIAGNOSIS — R110 Nausea: Secondary | ICD-10-CM

## 2020-02-06 ENCOUNTER — Other Ambulatory Visit: Payer: Self-pay | Admitting: Physician Assistant

## 2020-02-06 DIAGNOSIS — K589 Irritable bowel syndrome without diarrhea: Secondary | ICD-10-CM

## 2020-02-06 NOTE — L&D Delivery Note (Addendum)
OB/GYN Faculty Practice Delivery Note  Amanda Simmons is a 25 y.o. [id number] s/p SVD at [redacted]w[redacted]d. She was admitted for IOL for postdates.   ROM: 3h 71m with clear fluid GBS Status: Negative  Labor Progress: Patient received Cytotec followed by AROM and subsequently progressed to complete.   Delivery Date/Time: 09/22/20 at 2037  Delivery: Called to room and patient was complete and pushing. Head delivered LOA. No nuchal cord present. Shoulder and body delivered in usual fashion. Infant with spontaneous cry, placed on mother's abdomen, dried and stimulated. Cord clamped x 2 after 1-minute delay, and cut by FOB under my direct supervision. Cord blood drawn. Placenta delivered spontaneously with gentle cord traction. Fundus firm with massage and Pitocin. Labia, perineum, vagina, and cervix were inspected. Right superficial periurethral laceration noted which was hemostatic and not repaired.  Placenta: 3 vessel cord, intact, sent to L&D Complications: None Lacerations: Superficial right periurethral laceration, hemostatic and not repaired EBL: 450 cc Analgesia: None  Infant: Viable female  APGARs 8 and 9  Jovita Kussmaul, MD  GME ATTESTATION:  I saw and evaluated the patient. I agree with the findings and the plan of care as documented in the resident's note. I personally performed the delivery and was present for the entire management of the patient.  Evalina Field, MD OB Fellow, Faculty [facility] 09/22/2020 10:12 PM

## 2020-02-12 ENCOUNTER — Other Ambulatory Visit: Payer: Self-pay | Admitting: Physician Assistant

## 2020-02-12 DIAGNOSIS — Z30011 Encounter for initial prescription of contraceptive pills: Secondary | ICD-10-CM

## 2020-02-24 ENCOUNTER — Encounter: Payer: Self-pay | Admitting: Physician Assistant

## 2020-02-24 ENCOUNTER — Other Ambulatory Visit: Payer: Self-pay | Admitting: Physician Assistant

## 2020-02-24 ENCOUNTER — Ambulatory Visit (INDEPENDENT_AMBULATORY_CARE_PROVIDER_SITE_OTHER): Payer: 59

## 2020-02-24 ENCOUNTER — Other Ambulatory Visit: Payer: Self-pay

## 2020-02-24 ENCOUNTER — Telehealth (INDEPENDENT_AMBULATORY_CARE_PROVIDER_SITE_OTHER): Payer: 59 | Admitting: Physician Assistant

## 2020-02-24 DIAGNOSIS — N644 Mastodynia: Secondary | ICD-10-CM

## 2020-02-24 DIAGNOSIS — R519 Headache, unspecified: Secondary | ICD-10-CM | POA: Diagnosis not present

## 2020-02-24 DIAGNOSIS — R630 Anorexia: Secondary | ICD-10-CM

## 2020-02-24 DIAGNOSIS — Z20822 Contact with and (suspected) exposure to covid-19: Secondary | ICD-10-CM

## 2020-02-24 DIAGNOSIS — N926 Irregular menstruation, unspecified: Secondary | ICD-10-CM

## 2020-02-24 DIAGNOSIS — Z3A11 11 weeks gestation of pregnancy: Secondary | ICD-10-CM

## 2020-02-24 DIAGNOSIS — N281 Cyst of kidney, acquired: Secondary | ICD-10-CM

## 2020-02-24 DIAGNOSIS — O3680X0 Pregnancy with inconclusive fetal viability, not applicable or unspecified: Secondary | ICD-10-CM

## 2020-02-24 DIAGNOSIS — R110 Nausea: Secondary | ICD-10-CM

## 2020-02-24 DIAGNOSIS — K5900 Constipation, unspecified: Secondary | ICD-10-CM | POA: Diagnosis not present

## 2020-02-24 DIAGNOSIS — R1013 Epigastric pain: Secondary | ICD-10-CM | POA: Diagnosis not present

## 2020-02-24 MED ORDER — OMEPRAZOLE 40 MG PO CPDR: 40.0000 mg | DELAYED_RELEASE_CAPSULE | Freq: Every day | ORAL | 2 refills | Status: DC

## 2020-02-24 MED ORDER — BUTALBITAL-APAP-CAFFEINE 50-325-40 MG PO TABS: 1.0000 | ORAL_TABLET | Freq: Four times a day (QID) | ORAL | 0 refills | Status: DC | PRN

## 2020-02-24 NOTE — Progress Notes (Signed)
Pt stated it started on Saturday had a migraine for 4 days has not been vaccinated wants a ultra sound on her stomach.

## 2020-02-24 NOTE — Progress Notes (Signed)
Patient ID: Amanda Simmons, female   DOB: 1995/12/14, 25 y.o.   MRN: [id number] .Marland KitchenVirtual Visit via Telephone Note  I connected with Amanda Simmons on 02/24/2020 at 10:10 AM EST by telephone and verified that I am speaking with the correct person using two identifiers.  Location: Patient: home Provider: clinic  .Participating in visit:  Patient: Amanda Simmons Provider: Tandy Gaw PA-C   I discussed the limitations, risks, security and privacy concerns of performing an evaluation and management service by telephone and the availability of in person appointments. I also discussed with the patient that there may be a patient responsible charge related to this service. The patient expressed understanding and agreed to proceed.   History of Present Illness: Pt is a 25 yo female with multiple concerns today including migraine for the last 4 days, epigastric pain, no appetite, breast tenderness, nausea.  She was seen at the beginning of December with what was thought to be viral gastroenteritis. Due to not having a period since her Mirena was taken out in May we did a pregnancy test which was negative. Patient symptoms have continued to worsen. She is worried she has an ulcer. Patient denies any hematochezia but stools have been dark. She is constipated.  She denies any fever or chills. Her daughter is being tested for COVID and she wonders if her headache could be due to COVID.  She would like her stomach and gallbladder evaluated today. She is very concerned with no appetite and abdominal/epigastric discomfort. She is also concerned about her breast tenderness. Left greater than right.   .. Active Ambulatory Problems    Diagnosis Date Noted  . 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 Viral sinusitis 12/06/2014  . Attention deficit hyperactivity disorder (ADHD) 01/17/2015  . Constipation 08/16/2015  . Depression 08/16/2015  . GAD (generalized anxiety  disorder) 03/28/2017  . Low libido 03/28/2017  . Skin lesion 11/05/2017  . Poison ivy dermatitis 11/05/2017  . Other insomnia 11/05/2017  . OCD (obsessive compulsive disorder) 03/24/2018  . Vaginal candidiasis 03/31/2019  . Lice 05/01/2019  . Breast mass 05/01/2019  . BV (bacterial vaginosis) 05/01/2019  . Iron deficiency anemia 05/28/2019  . Acute intractable headache 02/24/2020  . Epigastric pain 02/24/2020   Resolved Ambulatory Problems    Diagnosis Date Noted  . Need for meningococcal vaccination 12/06/2014  . Streptococcal sore throat 01/17/2015  . UTI (urinary tract infection) 06/08/2015  . Pregnancy 08/16/2015  . Nausea without vomiting 08/16/2015  . Supervision of normal first pregnancy 09/07/2015  . Depressed mood 09/22/2015  . Bereavement 09/22/2015  . Normal labor 04/14/2016  . Abscess 09/06/2016  . Current moderate episode of major depressive disorder without prior episode (HCC) 11/05/2017   Past Medical History:  Diagnosis Date  . ADD (attention deficit disorder)   . Anxiety    Reviewed med, allergy, problem list.     Observations/Objective: No acute distress Anxiety about health and "wants to know what is wrong with her" No cough  .Marland KitchenThere were no vitals filed for this visit. There is no height or weight on file to calculate BMI.     Assessment and Plan: Marland KitchenMarland KitchenDiagnoses and all orders for this visit:  [redacted] weeks gestation of pregnancy  Epigastric pain -     H. pylori breath test -     CBC with Differential/Platelet -     COMPLETE METABOLIC PANEL WITH GFR -     Novel Coronavirus, NAA ([facility]) -     omeprazole (PRILOSEC)  40 MG capsule; Take 1 capsule (40 mg total) by mouth daily. -     US Abdomen Complete  Acute intractable headache, unspecified headache type -     Novel Coronavirus, NAA ([facility]) -     butalbital-acetaminophen-caffeine (FIORICET) 50-325-40 MG tablet; Take 1-2 tablets by mouth every 6 (six) hours as needed for  headache.  Constipation, unspecified constipation type -     COMPLETE METABOLIC PANEL WITH GFR  Breast tenderness -     hCG, serum, qualitative  Missed period -     hCG, serum, qualitative  Close exposure to COVID-19 virus -     Novel Coronavirus, NAA ([facility])  Nausea -     US Abdomen Complete  No appetite -     US Abdomen Complete    Concern for covid due to symptoms and daughter being tested as well. covid swab sent to lab. Quarantine until results are in.   Multiple symptoms and concerning for pregnancy. HcG ordered in labs.   Many GI problems. Sounds like gastritis vs cholecystitis vs pregnancy.  STAT abdominal u/s showed live pregnancy.   Called patient this would explain many of her symptoms. OB u/s ordered. Needs to see OB. Estimated [redacted] weeks pregnant.    Constipation ok with miralax. Ok for omeprazole since symptoms are so severe. Use fioricet sparing for headache. Avoid NSAIDs.   Follow Up Instructions:    I discussed the assessment and treatment plan with the patient. The patient was provided an opportunity to ask questions and all were answered. The patient agreed with the plan and demonstrated an understanding of the instructions.   The patient was advised to call back or seek an in-person evaluation if the symptoms worsen or if the condition fails to improve as anticipated.  I provided 30 minutes of non-face-to-face time during this encounter.   Tandy Gaw, PA-C

## 2020-02-25 ENCOUNTER — Encounter: Payer: Self-pay | Admitting: Physician Assistant

## 2020-02-25 ENCOUNTER — Ambulatory Visit (INDEPENDENT_AMBULATORY_CARE_PROVIDER_SITE_OTHER): Payer: 59

## 2020-02-25 DIAGNOSIS — Z3A11 11 weeks gestation of pregnancy: Secondary | ICD-10-CM

## 2020-02-25 DIAGNOSIS — O3680X0 Pregnancy with inconclusive fetal viability, not applicable or unspecified: Secondary | ICD-10-CM

## 2020-02-26 DIAGNOSIS — Z3A11 11 weeks gestation of pregnancy: Secondary | ICD-10-CM | POA: Insufficient documentation

## 2020-02-26 NOTE — Progress Notes (Signed)
Amanda Simmons,   Estimated [redacted] weeks pregnant. Let me know if you need help getting in the OB.

## 2020-02-27 LAB — SPECIMEN STATUS REPORT

## 2020-02-27 LAB — NOVEL CORONAVIRUS, NAA: SARS-CoV-2, NAA: DETECTED — AB

## 2020-02-27 LAB — SARS-COV-2, NAA 2 DAY TAT

## 2020-02-27 NOTE — Progress Notes (Signed)
[place],   You are positive for covid.

## 2020-02-29 ENCOUNTER — Encounter: Payer: 59 | Admitting: Obstetrics & Gynecology

## 2020-02-29 ENCOUNTER — Encounter: Payer: Self-pay | Admitting: *Deleted

## 2020-02-29 ENCOUNTER — Telehealth: Payer: Self-pay | Admitting: *Deleted

## 2020-02-29 ENCOUNTER — Other Ambulatory Visit: Payer: Self-pay

## 2020-02-29 ENCOUNTER — Encounter: Payer: Self-pay | Admitting: Obstetrics & Gynecology

## 2020-02-29 VITALS — BP 109/74 | HR 78 | Wt 121.0 lb

## 2020-02-29 DIAGNOSIS — Z3480 Encounter for supervision of other normal pregnancy, unspecified trimester: Secondary | ICD-10-CM | POA: Insufficient documentation

## 2020-02-29 NOTE — Progress Notes (Signed)
Pt was here for a NOB appt.  She was roomed by D Sola,CMA.  Per Adela Lank the patient was accompanied by her boyfriend and 24 yo daughter.  The daughter was coughing and was ask if she was OK.  Mom stated that they had gotten over their quarantine of Covid but the daughter has been throwing up everyday.  A bedside U/S was done on patient because CMA could not get FHT with the doppler so TVU was done @ bedside to check for FHT only which were 163 BPM.She had a formal U/S on 02/24/20 in imaging to confirmation and dating.  The daughter started to cough and appeared as if she was going to vomit.  Mom says that she gets choked on her mucus.  I went out of room and got a wet washcloth and a bottle of water for the child.  Pt states that she tested positive on 02/19/19 but was out of quarantine.  Per the chart, pt did not have her Covid test until 02/24/19 and was notified by her PCP on 02/27/20 with positive results. As Dr Debroah Loop became aware of the situation he stated that he didn't feel comfortable seeing the patient with her and the daughter being symptomatic.  Kisha, adm assist ask the pt to have her boyfriend and daughter go to the car and wait until after the appt.  Pt became agitated and said her daughter was not leaving the room.  I returned to the room and told the patient that since they were symptomatic and the chart states that they are only only 4 days out from testing positive that we would need to reschedule her appt for next week.  Pt had already stated that she didn't feel like getting her PN labs drawn today anyway.  I explained that nothing was pressing that she be seen today.  Pt was in agreement to rescheduling.  Pt was not aggressive with me but was quite rude to [employer].  This was a synopsis of my encounter with the patient today.

## 2020-02-29 NOTE — Telephone Encounter (Signed)
Telephone call to patient to discuss today's visit.  Call went straight to voicemail.  Left message requesting call back.  Patient presented to office less than 5 days after receiving a positive covid result.  Patient did not accurately answer the screening questions.  Patient was rude to some staff members and did expose all staff members to COVID.

## 2020-03-01 NOTE — Telephone Encounter (Signed)
Second attempt to reach patient regarding her visit to [facility] on 03/01/19.   I will send Mychart Message.

## 2020-03-01 NOTE — Progress Notes (Signed)
See staff notes, appointment rescheduled due to Covid safety protocol

## 2020-03-01 NOTE — Telephone Encounter (Signed)
Patient returned call, we had lengthy discussion about the actions from her visit on 02/29/20.    I informed her of our expectations regarding complete and honest COVID screening that we will conduct prior to every visit.  I discussed that there cannot be rude comments made to our staff.   Patient stated she was not feeling well and she only spoke this way because that is the way she was spoken to.   Informed patient that going forward the expectation is to not have another incident.  Patient was in agreement and stated she had extended her apologies thru [place] for yesterdays events.

## 2020-03-01 NOTE — Progress Notes (Addendum)
Pt came to office for NOB appt. When I was doing pt intake pt sounded sick (congested and minimal coughing). I asked if she was okay and if the family had recently had covid. Pt stated that her and her daughter had covid. Pt stated that the child was still throwing up every day due to the amount of congestion and mucus she had. Pt also stated she still didn't feel well enough to have any labs drawn today. I attempted to get FHT with doppler but, considering pt GA I was unable to do so. I moved pt to the ultrasound room and informed Mariel Aloe, RN that pt needed ultrasound. I also informed Mervyn Gay what pt had told me about recent covid diagnosis and symptoms. A little while later Mervyn Gay came out of the room to get a washcloth and to let me know that the pt's daughter was going to throw up. I went to [employer] and informed him of current situation. At that time Dr.Arnold requested that the child be removed from the exam room due to being symptomatic. Nakisha Pender-Joyner then asked pt to remove child from room due to symptoms. Pt became agitated and informed Leafy Ro that her daughter would not be leaving the room. Dr.Arnold was notified. At that time we found in her chart that she tested for COVID on 1/19 and was notified on 1/22 by PCP that she was positive. Pt was informed that the rest of her visit would need to be rescheduled (due to Dr.Arnold request). When pt came out of exam room I offered to reschedule her appointment. Pt was very pleasant towards me but, let me know that she did not want to be scheduled with Dr.Arnold. She also made a comment about our office being stupid for not understanding that mucus can make her child throw up. She then made a comment about Leafy Ro and said, "I am not dealing with her. 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 Pt was rescheduled for 1/31.

## 2020-03-01 NOTE — Progress Notes (Addendum)
I reviewed the notes from 02/29/20 by York Cerise at [facility]. It was related to me that the patient said she was cleared to be out of quarantine with a recent positive Covid test. She had already been brought back to the exam room based on her assurance. She reported symptoms and her child appeared to have symptoms as well. Chart review indicated her test had been sent 02/24/20 with result on 1/22. Based on the current Covid protocol her non-urgent visit needed to be rescheduled and this was done. There was concern to minimize further exposure to office staff, who saw the patient without sufficient PPE. Abusive language directed to office staff was reported to me as well. In order to decrease exposure I did not see the patient as all this transpired prior to me entering a room with her.   Adam Phenix, MD

## 2020-03-07 ENCOUNTER — Other Ambulatory Visit (HOSPITAL_COMMUNITY)
Admission: RE | Admit: 2020-03-07 | Discharge: 2020-03-07 | Disposition: A | Discharge status: Home / Self Care | Payer: 59 | Source: Ambulatory Visit | Attending: Obstetrics & Gynecology | Admitting: Obstetrics & Gynecology

## 2020-03-07 ENCOUNTER — Other Ambulatory Visit: Payer: Self-pay

## 2020-03-07 ENCOUNTER — Encounter: Payer: Self-pay | Admitting: Obstetrics & Gynecology

## 2020-03-07 ENCOUNTER — Ambulatory Visit (INDEPENDENT_AMBULATORY_CARE_PROVIDER_SITE_OTHER): Payer: 59 | Admitting: Obstetrics & Gynecology

## 2020-03-07 VITALS — BP 115/69 | HR 90 | Wt 127.0 lb

## 2020-03-07 DIAGNOSIS — Z3490 Encounter for supervision of normal pregnancy, unspecified, unspecified trimester: Secondary | ICD-10-CM | POA: Diagnosis present

## 2020-03-07 NOTE — Addendum Note (Signed)
Addended by: Kathie Dike on: 03/07/2020 04:28 PM   Modules accepted: Orders

## 2020-03-07 NOTE — Patient Instructions (Signed)

## 2020-03-07 NOTE — Progress Notes (Signed)
  Subjective:    Amanda Simmons is a G2P1001 [redacted]w[redacted]d being seen today for her first obstetrical visit.  Her obstetrical history is significant for none. Patient does intend to breast feed. Pregnancy history fully reviewed.  Patient reports morning nause and vomiting..  Vitals:   03/07/20 1549  BP: 115/69  Pulse: 90  Weight: 127 lb (57.6 kg)    HISTORY: OB History  Gravida Para Term Preterm AB Living  2 1 1     1   SAB IAB Ectopic Multiple Live Births        0 1    # Outcome Date GA Lbr Len/2nd Weight Sex Delivery Anes PTL Lv  2 Current           1 Term 04/14/16 [redacted]w[redacted]d 11:44 / 02:00 8 lb 10.8 oz (3.935 kg) F Vag-Spont EPI  LIV   Past Medical History:  Diagnosis Date  . ADD (attention deficit disorder)   . Anxiety   . Depression 08/16/2015   PHQ-9 was 11.     Past Surgical History:  Procedure Laterality Date  . MOUTH SURGERY     Family History  Problem Relation Age of Onset  . Healthy Mother   . Healthy Father   . Diabetes Neg Hx   . Heart disease Neg Hx   . Hypertension Neg Hx   . Stroke Neg Hx   . Cancer Neg Hx      Exam    Uterus:     Pelvic Exam:    Perineum: No Hemorrhoids   Vulva: normal   Vagina:  Yellowish frothy discharge   pH: n/a   Cervix: +ectropian that bleeds after pap   Adnexa: normal adnexa   Bony Pelvis: average  System: Breast:  pt did not remove bra for exam   Skin: normal coloration and turgor, no rashes    Neurologic: oriented, normal mood   Extremities: no deformities   HEENT sclera clear, anicteric, oropharynx clear, no lesions, neck supple with midline trachea, thyroid without masses and trachea midline   Mouth/Teeth mucous membranes moist, pharynx normal without lesions and dental hygiene good   Neck supple and no masses   Cardiovascular: regular rate and rhythm   Respiratory:  appears well, vitals normal, no respiratory distress, acyanotic, normal RR, chest clear, no wheezing, crepitations, rhonchi, normal symmetric air entry    Abdomen: soft, non-tender; bowel sounds normal; no masses,  no organomegaly   Urinary: urethral meatus normal      Assessment:  25 yo G2P1001 Normal second pregnancy Cervical ectropion  Patient Active Problem List   Diagnosis Date Noted  . Supervision of other normal pregnancy, antepartum 02/29/2020  . Epigastric pain 02/24/2020  . Iron deficiency anemia 05/28/2019  . OCD (obsessive compulsive disorder) 03/24/2018  . GAD (generalized anxiety disorder) 03/28/2017  . Constipation 08/16/2015  . Depression 08/16/2015  . Attention deficit hyperactivity disorder (ADHD) 01/17/2015  . Allergic rhinitis 10/23/2009         Plan:     Initial labs drawn. Prenatal vitamins. Problem list reviewed and updated. Genetic Screening:  NIPS and AFP Anatomy 10/25/2009 ordered Baby Scripts Schedule Optimization UDS universal Pt declines medications for N/V RTC 8 weeks (around 20 weeks)  [place] 03/07/2020

## 2020-03-09 LAB — CULTURE, OB URINE

## 2020-03-09 LAB — URINE CULTURE, OB REFLEX: Organism ID, Bacteria: NO GROWTH

## 2020-03-10 LAB — CBC WITH DIFFERENTIAL/PLATELET
Absolute Monocytes: 385 cells/uL (ref 200–950)
Basophils Absolute: 22 cells/uL (ref 0–200)
Basophils Relative: 0.3 %
Eosinophils Absolute: 0 cells/uL — ABNORMAL LOW (ref 15–500)
Eosinophils Relative: 0 %
HCT: 40.5 % (ref 35.0–45.0)
Hemoglobin: 14.1 g/dL (ref 11.7–15.5)
Lymphs Abs: 1043 cells/uL (ref 850–3900)
MCH: 30.9 pg (ref 27.0–33.0)
MCHC: 34.7 g/dL (ref 32.0–36.0)
MCV: 88.8 fL (ref 80.0–100.0)
MPV: 10.9 fL (ref 7.5–12.5)
Monocytes Relative: 5.2 %
Neutro Abs: 5950 cells/uL (ref 1500–7800)
Neutrophils Relative %: 80.4 %
Platelets: 275 10*3/uL (ref 140–400)
RBC: 4.56 10*6/uL (ref 3.80–5.10)
RDW: 12 % (ref 11.0–15.0)
Total Lymphocyte: 14.1 %
WBC: 7.4 10*3/uL (ref 3.8–10.8)

## 2020-03-10 LAB — DRUG MONITOR, PANEL 3, W/CONF, URINE
Amphetamines: NEGATIVE ng/mL (ref <500)
Benzodiazepines: NEGATIVE ng/mL (ref <100)
Cocaine Metabolite: NEGATIVE ng/mL (ref <150)
Creatinine: 63.2 mg/dL
Marijuana Metabolite: 182 ng/mL — ABNORMAL HIGH (ref <5)
Marijuana Metabolite: POSITIVE ng/mL — AB (ref <20)
Opiates: NEGATIVE ng/mL (ref <100)
Oxidant: NEGATIVE ug/mL
Oxycodone: NEGATIVE ng/mL (ref <100)
pH: 5.5 (ref 4.5–9.0)

## 2020-03-10 LAB — HEPATITIS C ANTIBODY
Hepatitis C Ab: NONREACTIVE
SIGNAL TO CUT-OFF: 0.01 (ref <1.00)

## 2020-03-10 LAB — HIV ANTIBODY (ROUTINE TESTING W REFLEX): HIV 1&2 Ab, 4th Generation: NONREACTIVE

## 2020-03-10 LAB — HEMOGLOBINOPATHY EVALUATION
Fetal Hemoglobin Testing: 0.3 % (ref 0.0–1.9)
HCT: 40.5 % (ref 35.0–45.0)
Hemoglobin A2 - HGBRFX: 2.6 % (ref 2.2–3.2)
Hemoglobin: 14.1 g/dL (ref 11.7–15.5)
Hgb A: 97.1 % (ref >96.0)
MCH: 30.9 pg (ref 27.0–33.0)
MCV: 88.8 fL (ref 80.0–100.0)
RBC: 4.56 10*6/uL (ref 3.80–5.10)
RDW: 12 % (ref 11.0–15.0)

## 2020-03-10 LAB — OBSTETRIC PANEL
Antibody Screen: NOT DETECTED
Hepatitis B Surface Ag: NONREACTIVE
RPR Ser Ql: NONREACTIVE
Rubella: 4.4 Index

## 2020-03-10 LAB — DM TEMPLATE

## 2020-03-11 LAB — CYTOLOGY - PAP
Chlamydia: NEGATIVE
Comment: NEGATIVE
Comment: NEGATIVE
Comment: NORMAL
Diagnosis: HIGH — AB
High risk HPV: POSITIVE — AB
Neisseria Gonorrhea: NEGATIVE

## 2020-03-14 ENCOUNTER — Encounter: Payer: Self-pay | Admitting: Obstetrics & Gynecology

## 2020-03-14 DIAGNOSIS — R87611 Atypical squamous cells cannot exclude high grade squamous intraepithelial lesion on cytologic smear of cervix (ASC-H): Secondary | ICD-10-CM | POA: Insufficient documentation

## 2020-03-16 ENCOUNTER — Encounter: Payer: Self-pay | Admitting: *Deleted

## 2020-03-16 DIAGNOSIS — Z3480 Encounter for supervision of other normal pregnancy, unspecified trimester: Secondary | ICD-10-CM

## 2020-03-21 ENCOUNTER — Encounter: Payer: Self-pay | Admitting: *Deleted

## 2020-03-21 DIAGNOSIS — Z3480 Encounter for supervision of other normal pregnancy, unspecified trimester: Secondary | ICD-10-CM

## 2020-04-25 ENCOUNTER — Ambulatory Visit: Payer: Medicaid Other | Attending: Obstetrics & Gynecology

## 2020-04-25 ENCOUNTER — Encounter: Payer: Self-pay | Admitting: Obstetrics and Gynecology

## 2020-04-25 ENCOUNTER — Other Ambulatory Visit: Payer: Self-pay | Admitting: Obstetrics & Gynecology

## 2020-04-25 ENCOUNTER — Other Ambulatory Visit: Payer: Self-pay | Admitting: *Deleted

## 2020-04-25 ENCOUNTER — Other Ambulatory Visit: Payer: Self-pay

## 2020-04-25 ENCOUNTER — Ambulatory Visit (INDEPENDENT_AMBULATORY_CARE_PROVIDER_SITE_OTHER): Payer: 59 | Admitting: Obstetrics and Gynecology

## 2020-04-25 VITALS — BP 112/76 | HR 81 | Wt 133.0 lb

## 2020-04-25 DIAGNOSIS — R87611 Atypical squamous cells cannot exclude high grade squamous intraepithelial lesion on cytologic smear of cervix (ASC-H): Secondary | ICD-10-CM

## 2020-04-25 DIAGNOSIS — Z362 Encounter for other antenatal screening follow-up: Secondary | ICD-10-CM

## 2020-04-25 DIAGNOSIS — Z363 Encounter for antenatal screening for malformations: Secondary | ICD-10-CM | POA: Insufficient documentation

## 2020-04-25 DIAGNOSIS — Z3490 Encounter for supervision of normal pregnancy, unspecified, unspecified trimester: Secondary | ICD-10-CM

## 2020-04-25 DIAGNOSIS — Z3493 Encounter for supervision of normal pregnancy, unspecified, third trimester: Secondary | ICD-10-CM | POA: Insufficient documentation

## 2020-04-25 DIAGNOSIS — Z3A19 19 weeks gestation of pregnancy: Secondary | ICD-10-CM

## 2020-04-25 DIAGNOSIS — Z3480 Encounter for supervision of other normal pregnancy, unspecified trimester: Secondary | ICD-10-CM

## 2020-04-25 NOTE — Progress Notes (Signed)
° °  PRENATAL VISIT NOTE  Subjective:  Amanda Simmons is a 25 y.o. G2P1001 at [redacted]w[redacted]d being seen today for ongoing prenatal care.  She is currently monitored for the following issues for this high-risk pregnancy and has Attention deficit hyperactivity disorder (ADHD); Constipation; Depression; GAD (generalized anxiety disorder); OCD (obsessive compulsive disorder); Iron deficiency anemia; Epigastric pain; Supervision of other normal pregnancy, antepartum; Allergic rhinitis; and Atypical squamous cells cannot exclude high grade squamous intraepithelial lesion on cytologic smear of cervix (ASC-H) on their problem list.  Patient reports no complaints.  Contractions: Not present. Vag. Bleeding: None.  Movement: Present. Denies leaking of fluid.   The following portions of the patient's history were reviewed and updated as appropriate: allergies, current medications, past family history, past medical history, past social history, past surgical history and problem list.   Objective:   Vitals:   04/25/20 1511  BP: 112/76  Pulse: 81  Weight: 133 lb (60.3 kg)    Fetal Status:     Movement: Present     General:  Alert, oriented and cooperative. Patient is in no acute distress.  Skin: Skin is warm and dry. No rash noted.   Cardiovascular: Normal heart rate noted  Respiratory: Normal respiratory effort, no problems with respiration noted  Abdomen: Soft, gravid, appropriate for gestational age.  Pain/Pressure: Present     Pelvic: Cervical exam deferred        Extremities: Normal range of motion.  Edema: None  Mental Status: Normal mood and affect. Normal behavior. Normal judgment and thought content.   Assessment and Plan:  Pregnancy: G2P1001 at [redacted]w[redacted]d 1. Supervision of other normal pregnancy, antepartum AFP today  2. Atypical squamous cells cannot exclude high grade squamous intraepithelial lesion on cytologic smear of cervix (ASC-H) colpo done today, see note  3. [redacted] weeks gestation of  pregnancy   Preterm labor symptoms and general obstetric precautions including but not limited to vaginal bleeding, contractions, leaking of fluid and fetal movement were reviewed in detail with the patient. Please refer to After Visit Summary for other counseling recommendations.   Return in about 4 weeks (around 05/23/2020) for high OB, in person.  Future Appointments  Date Time Provider Department Center  04/25/2020  3:15 PM Conan Bowens, MD [facility]  05/24/2020  1:00 PM [facility] NURSE [facility]  05/24/2020  1:15 PM [facility] US2 [facility]    Conan Bowens, MD

## 2020-04-25 NOTE — Progress Notes (Signed)
Colposcopy Procedure Note  Amanda Simmons is a 25 y.o. G2P1001 here for colposcopy.  Indications:  ASC-H pap  Procedure Details  LMP n/a - patient is pregnant    The risks (including infection, bleeding, pain) and benefits of the procedure were explained to the patient and written informed consent was obtained.  The patient was placed in the dorsal lithotomy position. A Graves was speculum inserted in the vagina, and the cervix was visualized.  The cervix was stained with acetic acid and visualized using the colposcope under magnification as well as with a green filter. Findings as below. No biopsies were taken. Patient tolerated procedure well.  Findings: prominent ectropion, acetowhite at 3 o'clock  Impression: low grade  Adequate: yes  Specimens: none  Condition: Stable  Complications: None  Plan: The patient was advised to call for any fever or for prolonged or severe pain or bleeding. She was advised to use OTC analgesics as needed for mild to moderate pain. She was advised to avoid vaginal intercourse for 48 hours or until the bleeding has completely stopped.  Repeat colpo post partum   K. Therese Sarah, MD, [facility] Attending Center for [facility])

## 2020-04-26 LAB — ALPHA FETOPROTEIN, MATERNAL
AFP MoM: 0.91
AFP, Serum: 48.6 ng/mL
Calc'd Gestational Age: 18.7 weeks
Maternal Wt: 133 [lb_av]
Risk for ONTD: <1
Twins-AFP: 1

## 2020-05-24 ENCOUNTER — Ambulatory Visit: Payer: Medicaid Other | Admitting: *Deleted

## 2020-05-24 ENCOUNTER — Other Ambulatory Visit: Payer: Self-pay | Admitting: *Deleted

## 2020-05-24 ENCOUNTER — Encounter: Payer: Self-pay | Admitting: *Deleted

## 2020-05-24 ENCOUNTER — Ambulatory Visit: Payer: Medicaid Other | Attending: Obstetrics and Gynecology

## 2020-05-24 ENCOUNTER — Other Ambulatory Visit: Payer: Self-pay

## 2020-05-24 DIAGNOSIS — Z3480 Encounter for supervision of other normal pregnancy, unspecified trimester: Secondary | ICD-10-CM

## 2020-05-24 DIAGNOSIS — O283 Abnormal ultrasonic finding on antenatal screening of mother: Secondary | ICD-10-CM

## 2020-05-24 DIAGNOSIS — O35EXX0 Maternal care for other (suspected) fetal abnormality and damage, fetal genitourinary anomalies, not applicable or unspecified: Secondary | ICD-10-CM

## 2020-05-24 DIAGNOSIS — Z3A23 23 weeks gestation of pregnancy: Secondary | ICD-10-CM | POA: Diagnosis not present

## 2020-05-24 DIAGNOSIS — Z362 Encounter for other antenatal screening follow-up: Secondary | ICD-10-CM | POA: Insufficient documentation

## 2020-05-24 DIAGNOSIS — O358XX0 Maternal care for other (suspected) fetal abnormality and damage, not applicable or unspecified: Secondary | ICD-10-CM

## 2020-05-30 ENCOUNTER — Encounter: Payer: 59 | Admitting: Obstetrics & Gynecology

## 2020-06-07 ENCOUNTER — Telehealth: Payer: Self-pay | Admitting: Physician Assistant

## 2020-06-07 NOTE — Telephone Encounter (Signed)
Patient left voicemail at 12:20pm while we were closed for lunch. I attempted to reach patient again at the number she left [place] which is the number on her chart. We have been trying to reach patient since this morning with no results.Multiple calls made to patient. Just FYI.

## 2020-06-07 NOTE — Telephone Encounter (Signed)
Patient left voicemail that she is 6 months pregnant and throwing up. States she had not ate since yesterday and wanted to be seen to see what is going on. I attempted to reach patient twice with no response at 10:09am and there was no voicemail set up. Terri also attempted to contact patient with no answer. States it keeps ringing. Per Laney Potash in UC, patient went to be seen in UC. Just FYI.

## 2020-06-07 NOTE — Telephone Encounter (Signed)
Ok she can also schedule follow up with OB as well.

## 2020-06-08 ENCOUNTER — Encounter: Payer: 59 | Admitting: Obstetrics & Gynecology

## 2020-06-13 ENCOUNTER — Other Ambulatory Visit: Payer: Self-pay

## 2020-06-13 ENCOUNTER — Ambulatory Visit (INDEPENDENT_AMBULATORY_CARE_PROVIDER_SITE_OTHER): Payer: 59 | Admitting: Obstetrics & Gynecology

## 2020-06-13 VITALS — BP 112/67 | HR 93 | Wt 146.0 lb

## 2020-06-13 DIAGNOSIS — Z3A26 26 weeks gestation of pregnancy: Secondary | ICD-10-CM

## 2020-06-13 DIAGNOSIS — J309 Allergic rhinitis, unspecified: Secondary | ICD-10-CM

## 2020-06-13 DIAGNOSIS — Z3480 Encounter for supervision of other normal pregnancy, unspecified trimester: Secondary | ICD-10-CM

## 2020-06-13 NOTE — Patient Instructions (Signed)

## 2020-06-13 NOTE — Progress Notes (Signed)
   PRENATAL VISIT NOTE  Subjective:  Amanda Simmons is a 25 y.o. G2P1001 at [redacted]w[redacted]d being seen today for ongoing prenatal care.  She is currently monitored for the following issues for this low-risk pregnancy and has Attention deficit hyperactivity disorder (ADHD); Constipation; Depression; GAD (generalized anxiety disorder); OCD (obsessive compulsive disorder); Iron deficiency anemia; Epigastric pain; Supervision of other normal pregnancy, antepartum; Allergic rhinitis; and Atypical squamous cells cannot exclude high grade squamous intraepithelial lesion on cytologic smear of cervix (ASC-H) on their problem list.  Patient reports back pain after working.  Contractions: Not present. Vag. Bleeding: None.  Movement: Present. Denies leaking of fluid.   The following portions of the patient's history were reviewed and updated as appropriate: allergies, current medications, past family history, past medical history, past social history, past surgical history and problem list.   Objective:   Vitals:   06/13/20 1609  BP: 112/67  Pulse: 93  Weight: 146 lb (66.2 kg)    Fetal Status:     Movement: Present     General:  Alert, oriented and cooperative. Patient is in no acute distress.  Skin: Skin is warm and dry. No rash noted.   Cardiovascular: Normal heart rate noted  Respiratory: Normal respiratory effort, no problems with respiration noted  Abdomen: Soft, gravid, appropriate for gestational age.  Pain/Pressure: Absent     Pelvic: Cervical exam deferred        Extremities: Normal range of motion.  Edema: None  Mental Status: Normal mood and affect. Normal behavior. Normal judgment and thought content.   Assessment and Plan:  Pregnancy: G2P1001 at [redacted]w[redacted]d 1. [redacted] weeks gestation of pregnancy Getting 28 week labs on a Wednesday  2. Supervision of other normal pregnancy, antepartum F/U US with MFM scheudled for 11% EFW and bilateral mild UTD  Preterm labor symptoms and general obstetric  precautions including but not limited to vaginal bleeding, contractions, leaking of fluid and fetal movement were reviewed in detail with the patient. Please refer to After Visit Summary for other counseling recommendations.   No follow-ups on file.  Future Appointments  Date Time Provider Department Center  06/21/2020  1:45 PM [facility] NURSE [facility]  06/21/2020  2:00 PM [facility] US1 [facility]    Elsie Lincoln, MD

## 2020-06-21 ENCOUNTER — Ambulatory Visit: Payer: 59 | Admitting: *Deleted

## 2020-06-21 ENCOUNTER — Other Ambulatory Visit: Payer: Self-pay | Admitting: Obstetrics

## 2020-06-21 ENCOUNTER — Ambulatory Visit: Payer: 59 | Attending: Obstetrics

## 2020-06-21 ENCOUNTER — Other Ambulatory Visit: Payer: Self-pay

## 2020-06-21 ENCOUNTER — Encounter: Payer: Self-pay | Admitting: *Deleted

## 2020-06-21 DIAGNOSIS — Z3A27 27 weeks gestation of pregnancy: Secondary | ICD-10-CM

## 2020-06-21 DIAGNOSIS — O358XX0 Maternal care for other (suspected) fetal abnormality and damage, not applicable or unspecified: Secondary | ICD-10-CM | POA: Diagnosis present

## 2020-06-21 DIAGNOSIS — O35EXX0 Maternal care for other (suspected) fetal abnormality and damage, fetal genitourinary anomalies, not applicable or unspecified: Secondary | ICD-10-CM

## 2020-06-21 DIAGNOSIS — Z3480 Encounter for supervision of other normal pregnancy, unspecified trimester: Secondary | ICD-10-CM

## 2020-06-21 DIAGNOSIS — O283 Abnormal ultrasonic finding on antenatal screening of mother: Secondary | ICD-10-CM | POA: Diagnosis not present

## 2020-06-21 DIAGNOSIS — O365920 Maternal care for other known or suspected poor fetal growth, second trimester, not applicable or unspecified: Secondary | ICD-10-CM

## 2020-06-22 ENCOUNTER — Other Ambulatory Visit: Payer: Self-pay | Admitting: Obstetrics and Gynecology

## 2020-06-22 DIAGNOSIS — O365930 Maternal care for other known or suspected poor fetal growth, third trimester, not applicable or unspecified: Secondary | ICD-10-CM

## 2020-06-22 DIAGNOSIS — O283 Abnormal ultrasonic finding on antenatal screening of mother: Secondary | ICD-10-CM

## 2020-06-29 ENCOUNTER — Other Ambulatory Visit: Payer: Self-pay

## 2020-06-29 ENCOUNTER — Other Ambulatory Visit (INDEPENDENT_AMBULATORY_CARE_PROVIDER_SITE_OTHER): Payer: 59

## 2020-06-29 DIAGNOSIS — Z3A28 28 weeks gestation of pregnancy: Secondary | ICD-10-CM

## 2020-06-29 DIAGNOSIS — Z3480 Encounter for supervision of other normal pregnancy, unspecified trimester: Secondary | ICD-10-CM

## 2020-06-29 NOTE — Progress Notes (Signed)
Pt here for 28 week labs only 

## 2020-06-30 LAB — 2HR GTT W 1 HR, CARPENTER, 75 G
Glucose, 1 Hr, Gest: 67 mg/dL (ref 65–179)
Glucose, 2 Hr, Gest: 62 mg/dL — ABNORMAL LOW (ref 65–152)
Glucose, Fasting, Gest: 70 mg/dL (ref 65–91)

## 2020-06-30 LAB — CBC
HCT: 32.8 % — ABNORMAL LOW (ref 35.0–45.0)
Hemoglobin: 10.9 g/dL — ABNORMAL LOW (ref 11.7–15.5)
MCH: 29.5 pg (ref 27.0–33.0)
MCHC: 33.2 g/dL (ref 32.0–36.0)
MCV: 88.6 fL (ref 80.0–100.0)
MPV: 10.3 fL (ref 7.5–12.5)
Platelets: 216 10*3/uL (ref 140–400)
RBC: 3.7 10*6/uL — ABNORMAL LOW (ref 3.80–5.10)
RDW: 12.2 % (ref 11.0–15.0)
WBC: 5.7 10*3/uL (ref 3.8–10.8)

## 2020-06-30 LAB — RPR: RPR Ser Ql: NONREACTIVE

## 2020-06-30 LAB — HIV ANTIBODY (ROUTINE TESTING W REFLEX): HIV 1&2 Ab, 4th Generation: NONREACTIVE

## 2020-07-06 ENCOUNTER — Ambulatory Visit: Payer: 59

## 2020-07-07 ENCOUNTER — Ambulatory Visit: Payer: 59 | Admitting: *Deleted

## 2020-07-07 ENCOUNTER — Ambulatory Visit: Payer: 59 | Attending: Obstetrics and Gynecology

## 2020-07-07 ENCOUNTER — Other Ambulatory Visit: Payer: Self-pay

## 2020-07-07 ENCOUNTER — Encounter: Payer: Self-pay | Admitting: *Deleted

## 2020-07-07 DIAGNOSIS — Z3480 Encounter for supervision of other normal pregnancy, unspecified trimester: Secondary | ICD-10-CM | POA: Diagnosis present

## 2020-07-07 DIAGNOSIS — O283 Abnormal ultrasonic finding on antenatal screening of mother: Secondary | ICD-10-CM | POA: Insufficient documentation

## 2020-07-07 DIAGNOSIS — Z3A30 30 weeks gestation of pregnancy: Secondary | ICD-10-CM

## 2020-07-07 DIAGNOSIS — O358XX0 Maternal care for other (suspected) fetal abnormality and damage, not applicable or unspecified: Secondary | ICD-10-CM | POA: Diagnosis not present

## 2020-07-07 DIAGNOSIS — O365930 Maternal care for other known or suspected poor fetal growth, third trimester, not applicable or unspecified: Secondary | ICD-10-CM | POA: Diagnosis not present

## 2020-07-08 ENCOUNTER — Other Ambulatory Visit: Payer: Self-pay | Admitting: *Deleted

## 2020-07-08 DIAGNOSIS — O365930 Maternal care for other known or suspected poor fetal growth, third trimester, not applicable or unspecified: Secondary | ICD-10-CM

## 2020-07-11 ENCOUNTER — Telehealth (INDEPENDENT_AMBULATORY_CARE_PROVIDER_SITE_OTHER): Payer: 59 | Admitting: Obstetrics & Gynecology

## 2020-07-11 ENCOUNTER — Other Ambulatory Visit: Payer: Self-pay

## 2020-07-11 ENCOUNTER — Encounter: Payer: Self-pay | Admitting: Obstetrics & Gynecology

## 2020-07-11 ENCOUNTER — Telehealth: Payer: Self-pay

## 2020-07-11 DIAGNOSIS — Z3689 Encounter for other specified antenatal screening: Secondary | ICD-10-CM

## 2020-07-11 DIAGNOSIS — Z3480 Encounter for supervision of other normal pregnancy, unspecified trimester: Secondary | ICD-10-CM

## 2020-07-11 DIAGNOSIS — Z3A30 30 weeks gestation of pregnancy: Secondary | ICD-10-CM

## 2020-07-11 NOTE — Progress Notes (Signed)
Patient ID: Amanda Simmons, female   DOB: 21-Nov-1995, 25 y.o.   MRN: [id number]   I connected with Amanda Simmons 07/11/20 at  3:30 PM EDT by: MyChart video and verified that I am speaking with the correct person using two identifiers.  Patient is located at home and provider is located at [place].     The purpose of this virtual visit is to provide medical care while limiting exposure to the novel coronavirus. I discussed the limitations, risks, security and privacy concerns of performing an evaluation and management service by MyChart video and the availability of in person appointments. I also discussed with the patient that there may be a patient responsible charge related to this service. By engaging in this virtual visit, you consent to the provision of healthcare.  Additionally, you authorize for your insurance to be billed for the services provided during this visit.  The patient expressed understanding and agreed to proceed.  The following staff members participated in the virtual visit:  RN Chestine Spore    PRENATAL VISIT NOTE  Subjective:  Amanda Simmons is a 25 y.o. G2P1001 at [redacted]w[redacted]d  for phone visit for ongoing prenatal care.  She is currently monitored for the following issues for this high-risk pregnancy and has Attention deficit hyperactivity disorder (ADHD); Constipation; Depression; GAD (generalized anxiety disorder); OCD (obsessive compulsive disorder); Iron deficiency anemia; Epigastric pain; Supervision of other normal pregnancy, antepartum; Allergic rhinitis; and Atypical squamous cells cannot exclude high grade squamous intraepithelial lesion on cytologic smear of cervix (ASC-H) on their problem list.  Patient reports no complaints.  Contractions: Not present. Vag. Bleeding: None.  Movement: Present. Denies leaking of fluid.   The following portions of the patient's history were reviewed and updated as appropriate: allergies, current medications, past family history, past  medical history, past social history, past surgical history and problem list.   Objective:   Vitals:   07/11/20 1512  BP: 103/63  Pulse: 88   Self-Obtained  Fetal Status:     Movement: Present     Assessment and Plan:  Pregnancy: G2P1001 at [redacted]w[redacted]d 1. Supervision of other normal pregnancy, antepartum  2.  Elevated UA dopplers Serial [place] and growth with MFM Fetal kick counts  Preterm labor symptoms and general obstetric precautions including but not limited to vaginal bleeding, contractions, leaking of fluid and fetal movement were reviewed in detail with the patient. Please refer to After Visit Summary for other counseling recommendations.   No follow-ups on file.  Future Appointments  Date Time Provider Department Center  07/13/2020  1:30 PM [facility] NURSE [facility]  07/13/2020  1:45 PM [facility] US4 [facility]  07/20/2020  1:30 PM [facility] NURSE [facility]  07/20/2020  1:45 PM [facility] US4 [facility]  07/27/2020  2:15 PM [facility] NURSE [facility]  07/27/2020  2:30 PM [facility] US3 [facility]  08/03/2020  2:30 PM [facility] NURSE [facility]  08/03/2020  2:45 PM [facility] US4 [facility]     Time spent on virtual visit: 12 minutes  Elsie Lincoln, MD

## 2020-07-11 NOTE — Telephone Encounter (Signed)
Attempted to call pt twice to begin virtual visit. No option to leave a voicemail. MyChart message sent asking pt to begin virtual visit.

## 2020-07-13 ENCOUNTER — Ambulatory Visit: Payer: Medicaid Other | Attending: Obstetrics and Gynecology

## 2020-07-13 ENCOUNTER — Ambulatory Visit: Payer: Medicaid Other | Admitting: *Deleted

## 2020-07-13 ENCOUNTER — Other Ambulatory Visit: Payer: Self-pay

## 2020-07-13 ENCOUNTER — Encounter: Payer: Self-pay | Admitting: *Deleted

## 2020-07-13 DIAGNOSIS — Z3480 Encounter for supervision of other normal pregnancy, unspecified trimester: Secondary | ICD-10-CM | POA: Diagnosis present

## 2020-07-13 DIAGNOSIS — O365930 Maternal care for other known or suspected poor fetal growth, third trimester, not applicable or unspecified: Secondary | ICD-10-CM

## 2020-07-13 DIAGNOSIS — O283 Abnormal ultrasonic finding on antenatal screening of mother: Secondary | ICD-10-CM | POA: Diagnosis present

## 2020-07-13 DIAGNOSIS — Z3A30 30 weeks gestation of pregnancy: Secondary | ICD-10-CM | POA: Diagnosis not present

## 2020-07-14 ENCOUNTER — Other Ambulatory Visit: Payer: Self-pay | Admitting: *Deleted

## 2020-07-14 DIAGNOSIS — O365990 Maternal care for other known or suspected poor fetal growth, unspecified trimester, not applicable or unspecified: Secondary | ICD-10-CM

## 2020-07-20 ENCOUNTER — Other Ambulatory Visit: Payer: Self-pay

## 2020-07-20 ENCOUNTER — Ambulatory Visit: Payer: 59

## 2020-07-25 ENCOUNTER — Other Ambulatory Visit: Payer: Self-pay

## 2020-07-25 ENCOUNTER — Encounter: Payer: Self-pay | Admitting: Obstetrics & Gynecology

## 2020-07-25 ENCOUNTER — Telehealth (INDEPENDENT_AMBULATORY_CARE_PROVIDER_SITE_OTHER): Payer: 59 | Admitting: Obstetrics & Gynecology

## 2020-07-25 VITALS — Wt 150.0 lb

## 2020-07-25 DIAGNOSIS — Z3A32 32 weeks gestation of pregnancy: Secondary | ICD-10-CM

## 2020-07-25 DIAGNOSIS — O99513 Diseases of the respiratory system complicating pregnancy, third trimester: Secondary | ICD-10-CM

## 2020-07-25 DIAGNOSIS — Z3480 Encounter for supervision of other normal pregnancy, unspecified trimester: Secondary | ICD-10-CM

## 2020-07-25 DIAGNOSIS — J069 Acute upper respiratory infection, unspecified: Secondary | ICD-10-CM

## 2020-07-25 NOTE — Progress Notes (Signed)
Coughing, ST, ears hurting.  Has not taken temp.  I connected with MAANSI WIKE 07/25/20 at 11:15 AM EDT by: MyChart video and verified that I am speaking with the correct person using two identifiers.  Patient is located at home and provider is located at [place].     The purpose of this virtual visit is to provide medical care while limiting exposure to the novel coronavirus. I discussed the limitations, risks, security and privacy concerns of performing an evaluation and management service by MyChart video and the availability of in person appointments. I also discussed with the patient that there may be a patient responsible charge related to this service. By engaging in this virtual visit, you consent to the provision of healthcare.  Additionally, you authorize for your insurance to be billed for the services provided during this visit.  The patient expressed understanding and agreed to proceed.  The following staff members participated in the virtual visit:  Mariel Aloe, RN    PRENATAL VISIT NOTE  Subjective:  Amanda Simmons is a 25 y.o. G2P1001 at [redacted]w[redacted]d  for phone visit for ongoing prenatal care.  She is currently monitored for the following issues for this low-risk pregnancy and has Attention deficit hyperactivity disorder (ADHD); Constipation; Depression; GAD (generalized anxiety disorder); OCD (obsessive compulsive disorder); Iron deficiency anemia; Epigastric pain; Supervision of other normal pregnancy, antepartum; Allergic rhinitis; and Atypical squamous cells cannot exclude high grade squamous intraepithelial lesion on cytologic smear of cervix (ASC-H) on their problem list.  Patient reports fatigue and sinus congestion, rhinorrhea, cough, sore throat .  Symptoms have been present for 4 days.  Contractions: Not present. Vag. Bleeding: None.  Movement: Present. Denies leaking of fluid.   The following portions of the patient's history were reviewed and updated as  appropriate: allergies, current medications, past family history, past medical history, past social history, past surgical history and problem list.   Objective:   Vitals:   07/25/20 1125  Weight: 150 lb (68 kg)   Self-Obtained  Fetal Status:     Movement: Present     Assessment and Plan:  Pregnancy: G2P1001 at [redacted]w[redacted]d 1. Viral URI Encouraged to take Covid test.  Pt says she has had Covid this pregnancy OTC Tylenol, Allegra D, Robitussin, Chloraseptic If not better by Friday will prescribe antibiotics If worsens or + for Covid, pt should call office or go seek emergency medical treatment.   Preterm labor symptoms and general obstetric precautions including but not limited to vaginal bleeding, contractions, leaking of fluid and fetal movement were reviewed in detail with the patient.  No follow-ups on file.  Future Appointments  Date Time Provider Department Center  07/29/2020 10:10 AM Donette Larry, CNM [facility]  08/11/2020  2:45 PM [facility] NURSE [facility]  08/11/2020  3:00 PM [facility] US1 [facility]     Time spent on virtual visit: 12 minutes  Amanda Lincoln, MD

## 2020-07-27 ENCOUNTER — Ambulatory Visit: Payer: 59

## 2020-07-29 ENCOUNTER — Other Ambulatory Visit: Payer: Self-pay

## 2020-07-29 ENCOUNTER — Ambulatory Visit (INDEPENDENT_AMBULATORY_CARE_PROVIDER_SITE_OTHER): Payer: 59 | Admitting: Certified Nurse Midwife

## 2020-07-29 VITALS — BP 111/68 | HR 86 | Wt 149.0 lb

## 2020-07-29 DIAGNOSIS — Z3A33 33 weeks gestation of pregnancy: Secondary | ICD-10-CM

## 2020-07-29 DIAGNOSIS — Z3480 Encounter for supervision of other normal pregnancy, unspecified trimester: Secondary | ICD-10-CM

## 2020-07-29 DIAGNOSIS — O365931 Maternal care for other known or suspected poor fetal growth, third trimester, fetus 1: Secondary | ICD-10-CM

## 2020-07-29 DIAGNOSIS — O365990 Maternal care for other known or suspected poor fetal growth, unspecified trimester, not applicable or unspecified: Secondary | ICD-10-CM | POA: Insufficient documentation

## 2020-07-29 NOTE — Progress Notes (Signed)
Subjective:  Amanda Simmons is a 25 y.o. G2P1001 at [redacted]w[redacted]d being seen today for ongoing prenatal care.  She is currently monitored for the following issues for this high-risk pregnancy and has Attention deficit hyperactivity disorder (ADHD); Constipation; Depression; GAD (generalized anxiety disorder); OCD (obsessive compulsive disorder); Iron deficiency anemia; Epigastric pain; Supervision of other normal pregnancy, antepartum; Allergic rhinitis; Atypical squamous cells cannot exclude high grade squamous intraepithelial lesion on cytologic smear of cervix (ASC-H); and IUGR (intrauterine growth restriction) affecting care of mother on their problem list.  Patient reports  pain in right bottom tooth near back. States pain started about a yr ago and dentist thought it was wisdom tooth coming in, recently pain is worse and radiating to right ear.   Contractions: Not present. Vag. Bleeding: None.  Movement: Present. Denies leaking of fluid.   The following portions of the patient's history were reviewed and updated as appropriate: allergies, current medications, past family history, past medical history, past social history, past surgical history and problem list. Problem list updated.  Objective:   Vitals:   07/29/20 1003  BP: 111/68  Pulse: 86  Weight: 149 lb (67.6 kg)    Fetal Status: Fetal Heart Rate (bpm): 136 Fundal Height: 33 cm Movement: Present  Presentation: Vertex  General:  Alert, oriented and cooperative. Patient is in no acute distress.  Skin: Skin is warm and dry. No rash noted.   Cardiovascular: Normal heart rate noted  Respiratory: Normal respiratory effort, no problems with respiration noted  Abdomen: Soft, gravid, appropriate for gestational age. Pain/Pressure: Absent     Pelvic: Vag. Bleeding: None Vag D/C Character: Thin   Cervical exam deferred        Extremities: Normal range of motion.  Edema: None  Mental Status: Normal mood and affect. Normal behavior. Normal  judgment and thought content.  Mouth: pink, moist, no erythema or drainage, normal dentition  Urinalysis:      Assessment and Plan:  Pregnancy: G2P1001 at [redacted]w[redacted]d  1. Supervision of other normal pregnancy, antepartum  2. Poor fetal growth affecting management of mother in third trimester, fetus 1 of multiple gestation  - 30%ile and normal dopplers on last [place] - f/u for AT with MFM scheduled  3. [redacted] weeks gestation  4. Tooth pain - needs f/u w/dentist ASAP - no obvious signs of abscess  Preterm labor symptoms and general obstetric precautions including but not limited to vaginal bleeding, contractions, leaking of fluid and fetal movement were reviewed in detail with the patient. Please refer to After Visit Summary for other counseling recommendations.  Return in about 2 weeks (around 08/12/2020).   Donette Larry, CNM

## 2020-07-29 NOTE — Progress Notes (Signed)
Would like to have Rt ear checked

## 2020-08-03 ENCOUNTER — Ambulatory Visit: Payer: 59

## 2020-08-11 ENCOUNTER — Ambulatory Visit: Payer: 59 | Admitting: *Deleted

## 2020-08-11 ENCOUNTER — Ambulatory Visit: Payer: 59 | Attending: Obstetrics and Gynecology

## 2020-08-11 ENCOUNTER — Other Ambulatory Visit: Payer: Self-pay

## 2020-08-11 ENCOUNTER — Other Ambulatory Visit: Payer: Self-pay | Admitting: Obstetrics

## 2020-08-11 VITALS — BP 111/68 | HR 90

## 2020-08-11 DIAGNOSIS — Z3480 Encounter for supervision of other normal pregnancy, unspecified trimester: Secondary | ICD-10-CM

## 2020-08-11 DIAGNOSIS — O365930 Maternal care for other known or suspected poor fetal growth, third trimester, not applicable or unspecified: Secondary | ICD-10-CM

## 2020-08-11 DIAGNOSIS — O283 Abnormal ultrasonic finding on antenatal screening of mother: Secondary | ICD-10-CM

## 2020-08-11 DIAGNOSIS — O35EXX0 Maternal care for other (suspected) fetal abnormality and damage, fetal genitourinary anomalies, not applicable or unspecified: Secondary | ICD-10-CM

## 2020-08-11 DIAGNOSIS — O358XX0 Maternal care for other (suspected) fetal abnormality and damage, not applicable or unspecified: Secondary | ICD-10-CM

## 2020-08-11 DIAGNOSIS — Z3A35 35 weeks gestation of pregnancy: Secondary | ICD-10-CM | POA: Diagnosis present

## 2020-08-19 ENCOUNTER — Ambulatory Visit (INDEPENDENT_AMBULATORY_CARE_PROVIDER_SITE_OTHER): Payer: 59 | Admitting: Certified Nurse Midwife

## 2020-08-19 ENCOUNTER — Other Ambulatory Visit: Payer: Self-pay

## 2020-08-19 ENCOUNTER — Other Ambulatory Visit (HOSPITAL_COMMUNITY)
Admission: RE | Admit: 2020-08-19 | Discharge: 2020-08-19 | Disposition: A | Discharge status: Home / Self Care | Payer: 59 | Source: Ambulatory Visit | Attending: Certified Nurse Midwife | Admitting: Certified Nurse Midwife

## 2020-08-19 VITALS — BP 104/64 | HR 92 | Wt 153.0 lb

## 2020-08-19 DIAGNOSIS — Z3403 Encounter for supervision of normal first pregnancy, third trimester: Secondary | ICD-10-CM

## 2020-08-19 DIAGNOSIS — O365931 Maternal care for other known or suspected poor fetal growth, third trimester, fetus 1: Secondary | ICD-10-CM

## 2020-08-19 DIAGNOSIS — Z3480 Encounter for supervision of other normal pregnancy, unspecified trimester: Secondary | ICD-10-CM

## 2020-08-19 DIAGNOSIS — Z3A36 36 weeks gestation of pregnancy: Secondary | ICD-10-CM

## 2020-08-19 NOTE — Progress Notes (Addendum)
Subjective:  Amanda Simmons is a 25 y.o. G2P1001 at [redacted]w[redacted]d being seen today for ongoing prenatal care.  She is currently monitored for the following issues for this low-risk pregnancy and has Attention deficit hyperactivity disorder (ADHD); Constipation; Depression; GAD (generalized anxiety disorder); OCD (obsessive compulsive disorder); Iron deficiency anemia; Epigastric pain; Supervision of other normal pregnancy, antepartum; Allergic rhinitis; Atypical squamous cells cannot exclude high grade squamous intraepithelial lesion on cytologic smear of cervix (ASC-H); and IUGR (intrauterine growth restriction) affecting care of mother on their problem list.  Patient reports no complaints.  Contractions: Not present. Vag. Bleeding: None.  Movement: Present. Denies leaking of fluid.   The following portions of the patient's history were reviewed and updated as appropriate: allergies, current medications, past family history, past medical history, past social history, past surgical history and problem list. Problem list updated.  Objective:   Vitals:   08/19/20 0935  BP: 104/64  Pulse: 92  Weight: 153 lb (69.4 kg)    Fetal Status: Fetal Heart Rate (bpm): 135 Fundal Height: 36 cm Movement: Present  Presentation: Vertex  General:  Alert, oriented and cooperative. Patient is in no acute distress.  Skin: Skin is warm and dry. No rash noted.   Cardiovascular: Normal heart rate noted  Respiratory: Normal respiratory effort, no problems with respiration noted  Abdomen: Soft, gravid, appropriate for gestational age. Pain/Pressure: Present     Pelvic: Vag. Bleeding: None Vag D/C Character: Thin   Cervical exam deferred        Extremities: Normal range of motion.  Edema: None  Mental Status: Normal mood and affect. Normal behavior. Normal judgment and thought content.   Urinalysis:      Assessment and Plan:  Pregnancy: G2P1001 at [redacted]w[redacted]d  1. Encounter for supervision of normal first pregnancy in  third trimester - Culture, beta strep (group b only) - Cervicovaginal ancillary only( [place])  2. Poor fetal growth affecting management of mother in third trimester, fetus 1 of multiple gestation - FGR resolved  3. Supervision of other normal pregnancy, antepartum   4. [redacted] weeks gestation   Preterm labor symptoms and general obstetric precautions including but not limited to vaginal bleeding, contractions, leaking of fluid and fetal movement were reviewed in detail with the patient. Please refer to After Visit Summary for other counseling recommendations.  Return in about 2 weeks (around 09/02/2020).   Donette Larry, CNM

## 2020-08-22 LAB — CULTURE, BETA STREP (GROUP B ONLY)
MICRO NUMBER:: 12124701
SPECIMEN QUALITY:: ADEQUATE

## 2020-08-22 LAB — CERVICOVAGINAL ANCILLARY ONLY
Chlamydia: NEGATIVE
Comment: NEGATIVE
Comment: NORMAL
Neisseria Gonorrhea: NEGATIVE

## 2020-08-26 ENCOUNTER — Telehealth: Payer: Self-pay | Admitting: Physician Assistant

## 2020-08-26 NOTE — Telephone Encounter (Signed)
Patient left a voicemail stating that she had been trying to schedule an appointment, stating she could not reach anyone. I had called her phone number 3 or 4 times and it rings and then hangs up. No way to leave patient a voicemail. I called her mother and stepdad *under the emergency contacts* and left a voicemail, trying to verify patients number and letting them know we were trying to reach her , retuurning her voicemail to schedule an appointment. Mother called back and verified the number we have in the chart is the most recent phone number for patient and that she would contact patient herself and have her call us back. AM

## 2020-08-31 ENCOUNTER — Other Ambulatory Visit: Payer: Self-pay

## 2020-08-31 ENCOUNTER — Ambulatory Visit (INDEPENDENT_AMBULATORY_CARE_PROVIDER_SITE_OTHER): Payer: 59 | Admitting: Obstetrics and Gynecology

## 2020-08-31 VITALS — BP 102/64 | HR 68 | Wt 155.0 lb

## 2020-08-31 DIAGNOSIS — Z3480 Encounter for supervision of other normal pregnancy, unspecified trimester: Secondary | ICD-10-CM

## 2020-08-31 DIAGNOSIS — Z3A37 37 weeks gestation of pregnancy: Secondary | ICD-10-CM

## 2020-08-31 NOTE — Progress Notes (Signed)
   PRENATAL VISIT NOTE  Subjective:  Amanda Simmons is a 25 y.o. G2P1001 at [redacted]w[redacted]d being seen today for ongoing prenatal care.  She is currently monitored for the following issues for this low-risk pregnancy and has Attention deficit hyperactivity disorder (ADHD); Constipation; Depression; GAD (generalized anxiety disorder); OCD (obsessive compulsive disorder); Iron deficiency anemia; Epigastric pain; Supervision of other normal pregnancy, antepartum; Allergic rhinitis; Atypical squamous cells cannot exclude high grade squamous intraepithelial lesion on cytologic smear of cervix (ASC-H); and IUGR (intrauterine growth restriction) affecting care of mother on their problem list.  Patient reports no complaints.  Contractions: Irritability. Vag. Bleeding: None.  Movement: Present. Denies leaking of fluid.   The following portions of the patient's history were reviewed and updated as appropriate: allergies, current medications, past family history, past medical history, past social history, past surgical history and problem list.   Objective:   Vitals:   08/31/20 1038  BP: 102/64  Pulse: 68  Weight: 155 lb (70.3 kg)    Fetal Status:   Fundal Height: 37 cm Movement: Present  Presentation: Vertex  General:  Alert, oriented and cooperative. Patient is in no acute distress.  Skin: Skin is warm and dry. No rash noted.   Cardiovascular: Normal heart rate noted  Respiratory: Normal respiratory effort, no problems with respiration noted  Abdomen: Soft, gravid, appropriate for gestational age.  Pain/Pressure: Present     Pelvic: Cervical exam performed in the presence of a chaperone Dilation: 1 Effacement (%): Thick Station: Ballotable  Extremities: Normal range of motion.  Edema: None  Mental Status: Normal mood and affect. Normal behavior. Normal judgment and thought content.   Assessment and Plan:  Pregnancy: G2P1001 at [redacted]w[redacted]d  1. Supervision of other normal pregnancy, antepartum  GBS  negative   Term labor symptoms and general obstetric precautions including but not limited to vaginal bleeding, contractions, leaking of fluid and fetal movement were reviewed in detail with the patient. Please refer to After Visit Summary for other counseling recommendations.   Return in about 1 week (around 09/07/2020), or Low risk.  No future appointments.  Venia Carbon, NP

## 2020-08-31 NOTE — Progress Notes (Signed)
+   Fetal movement. No complaints.  

## 2020-09-08 ENCOUNTER — Encounter: Payer: Self-pay | Admitting: Obstetrics and Gynecology

## 2020-09-08 ENCOUNTER — Ambulatory Visit (INDEPENDENT_AMBULATORY_CARE_PROVIDER_SITE_OTHER): Payer: 59 | Admitting: Obstetrics and Gynecology

## 2020-09-08 ENCOUNTER — Other Ambulatory Visit: Payer: Self-pay

## 2020-09-08 VITALS — BP 119/73 | HR 89 | Wt 154.0 lb

## 2020-09-08 DIAGNOSIS — Z3480 Encounter for supervision of other normal pregnancy, unspecified trimester: Secondary | ICD-10-CM

## 2020-09-08 NOTE — Progress Notes (Signed)
   PRENATAL VISIT NOTE  Subjective:  Amanda Simmons is a 25 y.o. G2P1001 at [redacted]w[redacted]d being seen today for ongoing prenatal care.  She is currently monitored for the following issues for this low-risk pregnancy and has Attention deficit hyperactivity disorder (ADHD); Constipation; Depression; GAD (generalized anxiety disorder); OCD (obsessive compulsive disorder); Iron deficiency anemia; Epigastric pain; Supervision of other normal pregnancy, antepartum; Allergic rhinitis; Atypical squamous cells cannot exclude high grade squamous intraepithelial lesion on cytologic smear of cervix (ASC-H); and IUGR (intrauterine growth restriction) affecting care of mother on their problem list.  Patient reports no complaints.  Contractions: Irritability. Vag. Bleeding: None.  Movement: Present. Denies leaking of fluid.   The following portions of the patient's history were reviewed and updated as appropriate: allergies, current medications, past family history, past medical history, past social history, past surgical history and problem list.   Objective:   Vitals:   09/08/20 1346  BP: 119/73  Pulse: 89  Weight: 154 lb (69.9 kg)    Fetal Status: Fetal Heart Rate (bpm): 128 Fundal Height: 39 cm Movement: Present  Presentation: Vertex  General:  Alert, oriented and cooperative. Patient is in no acute distress.  Skin: Skin is warm and dry. No rash noted.   Cardiovascular: Normal heart rate noted  Respiratory: Normal respiratory effort, no problems with respiration noted  Abdomen: Soft, gravid, appropriate for gestational age.  Pain/Pressure: Present     Pelvic: Cervical exam performed in the presence of a chaperone Dilation: 1 Effacement (%): Thick Station: ?  (Error. Value could not be saved.)  Extremities: Normal range of motion.  Edema: None  Mental Status: Normal mood and affect. Normal behavior. Normal judgment and thought content.   Assessment and Plan:  Pregnancy: G2P1001 at [redacted]w[redacted]d 1. Supervision  of other normal pregnancy, antepartum Patient is doing well without complaints Discussed IOL at 41 weeks Patient awaiting SOL IUGR resolved on 7/722 scan- no further growth ultrasound required  Term labor symptoms and general obstetric precautions including but not limited to vaginal bleeding, contractions, leaking of fluid and fetal movement were reviewed in detail with the patient. Please refer to After Visit Summary for other counseling recommendations.   Return in about 1 week (around 09/15/2020) for in person, ROB, Low risk.  No future appointments.  Catalina Antigua, MD

## 2020-09-16 ENCOUNTER — Telehealth (HOSPITAL_COMMUNITY): Payer: Self-pay | Admitting: *Deleted

## 2020-09-16 ENCOUNTER — Other Ambulatory Visit: Payer: Self-pay

## 2020-09-16 ENCOUNTER — Ambulatory Visit (INDEPENDENT_AMBULATORY_CARE_PROVIDER_SITE_OTHER): Payer: 59 | Admitting: Certified Nurse Midwife

## 2020-09-16 VITALS — BP 109/69 | HR 81 | Wt 155.0 lb

## 2020-09-16 DIAGNOSIS — Z3A40 40 weeks gestation of pregnancy: Secondary | ICD-10-CM

## 2020-09-16 DIAGNOSIS — Z3480 Encounter for supervision of other normal pregnancy, unspecified trimester: Secondary | ICD-10-CM | POA: Diagnosis not present

## 2020-09-16 NOTE — Telephone Encounter (Signed)
Preadmission screen  

## 2020-09-16 NOTE — Progress Notes (Signed)
Subjective:  Amanda Simmons is a 25 y.o. G2P1001 at [redacted]w[redacted]d being seen today for ongoing prenatal care.  She is currently monitored for the following issues for this low-risk pregnancy and has Attention deficit hyperactivity disorder (ADHD); Constipation; Depression; GAD (generalized anxiety disorder); OCD (obsessive compulsive disorder); Iron deficiency anemia; Epigastric pain; Supervision of other normal pregnancy, antepartum; Allergic rhinitis; Atypical squamous cells cannot exclude high grade squamous intraepithelial lesion on cytologic smear of cervix (ASC-H); and IUGR (intrauterine growth restriction) affecting care of mother on their problem list.  Patient reports no complaints.  Contractions: Irritability. Vag. Bleeding: None.  Movement: Present. Denies leaking of fluid.   The following portions of the patient's history were reviewed and updated as appropriate: allergies, current medications, past family history, past medical history, past social history, past surgical history and problem list. Problem list updated.  Objective:   Vitals:   09/16/20 1032  BP: 109/69  Pulse: 81  Weight: 155 lb (70.3 kg)    Fetal Status: Fetal Heart Rate (bpm): NST-R   Movement: Present  Presentation: Vertex  General:  Alert, oriented and cooperative. Patient is in no acute distress.  Skin: Skin is warm and dry. No rash noted.   Cardiovascular: Normal heart rate noted  Respiratory: Normal respiratory effort, no problems with respiration noted  Abdomen: Soft, gravid, appropriate for gestational age. Pain/Pressure: Present     Pelvic: Vag. Bleeding: None Vag D/C Character: Thin   Cervical exam performed Dilation: 1.5 Effacement (%): 50 Station: -3  Extremities: Normal range of motion.  Edema: None  Mental Status: Normal mood and affect. Normal behavior. Normal judgment and thought content.   Urinalysis:      Assessment and Plan:  Pregnancy: G2P1001 at [redacted]w[redacted]d  1. Supervision of other normal  pregnancy, antepartum  2. [redacted] weeks gestation of pregnancy - NST reactive, AFI 16.1 - consented for membrane sweep, completed and tolerated well - NST in 4 days - IOL next week  Term labor symptoms and general obstetric precautions including but not limited to vaginal bleeding, contractions, leaking of fluid and fetal movement were reviewed in detail with the patient. Please refer to After Visit Summary for other counseling recommendations.  Return in about 4 days (around 09/20/2020).   Donette Larry, CNM

## 2020-09-19 ENCOUNTER — Encounter (HOSPITAL_COMMUNITY): Payer: Self-pay

## 2020-09-19 ENCOUNTER — Telehealth (HOSPITAL_COMMUNITY): Payer: Self-pay | Admitting: *Deleted

## 2020-09-19 ENCOUNTER — Encounter (HOSPITAL_COMMUNITY): Payer: Self-pay | Admitting: *Deleted

## 2020-09-19 NOTE — Telephone Encounter (Signed)
Preadmission screen  

## 2020-09-20 ENCOUNTER — Ambulatory Visit (INDEPENDENT_AMBULATORY_CARE_PROVIDER_SITE_OTHER): Payer: 59 | Admitting: *Deleted

## 2020-09-20 ENCOUNTER — Other Ambulatory Visit: Payer: Self-pay

## 2020-09-20 ENCOUNTER — Other Ambulatory Visit: Payer: Self-pay | Admitting: Family Medicine

## 2020-09-20 VITALS — BP 105/70 | HR 81

## 2020-09-20 DIAGNOSIS — Z3480 Encounter for supervision of other normal pregnancy, unspecified trimester: Secondary | ICD-10-CM | POA: Diagnosis not present

## 2020-09-20 DIAGNOSIS — O365931 Maternal care for other known or suspected poor fetal growth, third trimester, fetus 1: Secondary | ICD-10-CM | POA: Diagnosis not present

## 2020-09-20 DIAGNOSIS — O480 Post-term pregnancy: Secondary | ICD-10-CM | POA: Diagnosis not present

## 2020-09-20 NOTE — Progress Notes (Signed)
Pt here for NST only due to post dates.  She is scheduled for IOL on 09/22/20

## 2020-09-21 ENCOUNTER — Other Ambulatory Visit: Payer: Self-pay | Admitting: Advanced Practice Midwife

## 2020-09-21 LAB — SARS CORONAVIRUS 2 (TAT 6-24 HRS): SARS Coronavirus 2: NEGATIVE

## 2020-09-22 ENCOUNTER — Inpatient Hospital Stay (HOSPITAL_COMMUNITY)
Admission: AD | Admit: 2020-09-22 | Discharge: 2020-09-23 | DRG: 806 | Disposition: A | Discharge status: Home / Self Care | Payer: 59 | Attending: Family Medicine | Admitting: Family Medicine

## 2020-09-22 ENCOUNTER — Inpatient Hospital Stay (HOSPITAL_COMMUNITY): Payer: 59

## 2020-09-22 ENCOUNTER — Other Ambulatory Visit: Payer: Self-pay

## 2020-09-22 ENCOUNTER — Encounter (HOSPITAL_COMMUNITY): Payer: Self-pay | Admitting: Family Medicine

## 2020-09-22 DIAGNOSIS — D509 Iron deficiency anemia, unspecified: Secondary | ICD-10-CM | POA: Diagnosis present

## 2020-09-22 DIAGNOSIS — Z3A41 41 weeks gestation of pregnancy: Secondary | ICD-10-CM

## 2020-09-22 DIAGNOSIS — O9902 Anemia complicating childbirth: Secondary | ICD-10-CM | POA: Diagnosis present

## 2020-09-22 DIAGNOSIS — O480 Post-term pregnancy: Secondary | ICD-10-CM | POA: Diagnosis present

## 2020-09-22 DIAGNOSIS — F1290 Cannabis use, unspecified, uncomplicated: Secondary | ICD-10-CM | POA: Diagnosis present

## 2020-09-22 DIAGNOSIS — Z3480 Encounter for supervision of other normal pregnancy, unspecified trimester: Secondary | ICD-10-CM

## 2020-09-22 DIAGNOSIS — O365931 Maternal care for other known or suspected poor fetal growth, third trimester, fetus 1: Secondary | ICD-10-CM

## 2020-09-22 DIAGNOSIS — O99324 Drug use complicating childbirth: Secondary | ICD-10-CM | POA: Diagnosis present

## 2020-09-22 LAB — TYPE AND SCREEN
ABO/RH(D): O POS
Antibody Screen: NEGATIVE

## 2020-09-22 LAB — CBC
HCT: 31.4 % — ABNORMAL LOW (ref 36.0–46.0)
Hemoglobin: 9.7 g/dL — ABNORMAL LOW (ref 12.0–15.0)
MCH: 25.3 pg — ABNORMAL LOW (ref 26.0–34.0)
MCHC: 30.9 g/dL (ref 30.0–36.0)
MCV: 82 fL (ref 80.0–100.0)
Platelets: 267 10*3/uL (ref 150–400)
RBC: 3.83 MIL/uL — ABNORMAL LOW (ref 3.87–5.11)
RDW: 14.1 % (ref 11.5–15.5)
WBC: 6.7 10*3/uL (ref 4.0–10.5)
nRBC: 0 % (ref 0.0–0.2)

## 2020-09-22 MED ORDER — SIMETHICONE 80 MG PO CHEW: 80.0000 mg | CHEWABLE_TABLET | ORAL | Status: DC | PRN

## 2020-09-22 MED ORDER — OXYTOCIN BOLUS FROM INFUSION: 333.0000 mL | Freq: Once | INTRAVENOUS | Status: DC

## 2020-09-22 MED ORDER — PHENYLEPHRINE 40 MCG/ML (10ML) SYRINGE FOR IV PUSH (FOR BLOOD PRESSURE SUPPORT): 80.0000 ug | PREFILLED_SYRINGE | INTRAVENOUS | Status: DC | PRN

## 2020-09-22 MED ORDER — EPHEDRINE 5 MG/ML INJ: 10.0000 mg | INTRAVENOUS | Status: DC | PRN

## 2020-09-22 MED ORDER — ONDANSETRON HCL 4 MG PO TABS: 4.0000 mg | ORAL_TABLET | ORAL | Status: DC | PRN

## 2020-09-22 MED ORDER — ACETAMINOPHEN 325 MG PO TABS
650.0000 mg | ORAL_TABLET | ORAL | Status: DC | PRN
Start: 2020-09-22 — End: 2020-09-22

## 2020-09-22 MED ORDER — TETANUS-DIPHTH-ACELL PERTUSSIS 5-2.5-18.5 LF-MCG/0.5 IM SUSY: 0.5000 mL | PREFILLED_SYRINGE | Freq: Once | INTRAMUSCULAR | Status: DC

## 2020-09-22 MED ORDER — OXYTOCIN-SODIUM CHLORIDE 30-0.9 UT/500ML-% IV SOLN
2.5000 [IU]/h | INTRAVENOUS | Status: DC
  Filled 2020-09-22: qty 500

## 2020-09-22 MED ORDER — LACTATED RINGERS IV SOLN: 500.0000 mL | INTRAVENOUS | Status: DC | PRN

## 2020-09-22 MED ORDER — ONDANSETRON HCL 4 MG/2ML IJ SOLN: 4.0000 mg | Freq: Four times a day (QID) | INTRAMUSCULAR | Status: DC | PRN

## 2020-09-22 MED ORDER — OXYCODONE-ACETAMINOPHEN 5-325 MG PO TABS: 2.0000 | ORAL_TABLET | ORAL | Status: DC | PRN

## 2020-09-22 MED ORDER — LACTATED RINGERS IV SOLN: INTRAVENOUS | Status: DC

## 2020-09-22 MED ORDER — TERBUTALINE SULFATE 1 MG/ML IJ SOLN: 0.2500 mg | Freq: Once | INTRAMUSCULAR | Status: DC | PRN

## 2020-09-22 MED ORDER — BENZOCAINE-MENTHOL 20-0.5 % EX AERO
1.0000 "application " | INHALATION_SPRAY | CUTANEOUS | Status: DC | PRN
  Administered 2020-09-22: 1 via TOPICAL
  Filled 2020-09-22: qty 56

## 2020-09-22 MED ORDER — IBUPROFEN 600 MG PO TABS
600.0000 mg | ORAL_TABLET | Freq: Four times a day (QID) | ORAL | Status: DC
  Administered 2020-09-22 – 2020-09-23 (×4): 600 mg via ORAL
  Filled 2020-09-22 (×4): qty 1

## 2020-09-22 MED ORDER — OXYCODONE-ACETAMINOPHEN 5-325 MG PO TABS
1.0000 | ORAL_TABLET | ORAL | Status: DC | PRN
Start: 2020-09-22 — End: 2020-09-22
  Administered 2020-09-22: 1 via ORAL
  Filled 2020-09-22: qty 1

## 2020-09-22 MED ORDER — ONDANSETRON HCL 4 MG/2ML IJ SOLN: 4.0000 mg | INTRAMUSCULAR | Status: DC | PRN

## 2020-09-22 MED ORDER — MEDROXYPROGESTERONE ACETATE 150 MG/ML IM SUSP: 150.0000 mg | INTRAMUSCULAR | Status: DC | PRN

## 2020-09-22 MED ORDER — FENTANYL CITRATE (PF) 100 MCG/2ML IJ SOLN: 100.0000 ug | INTRAMUSCULAR | Status: DC | PRN

## 2020-09-22 MED ORDER — PRENATAL MULTIVITAMIN CH
1.0000 | ORAL_TABLET | Freq: Every day | ORAL | Status: DC
  Administered 2020-09-23: 1 via ORAL
  Filled 2020-09-22: qty 1

## 2020-09-22 MED ORDER — SOD CITRATE-CITRIC ACID 500-334 MG/5ML PO SOLN: 30.0000 mL | ORAL | Status: DC | PRN

## 2020-09-22 MED ORDER — DIPHENHYDRAMINE HCL 50 MG/ML IJ SOLN
12.5000 mg | INTRAMUSCULAR | Status: DC | PRN
Start: 2020-09-22 — End: 2020-09-22

## 2020-09-22 MED ORDER — MISOPROSTOL 50MCG HALF TABLET
50.0000 ug | ORAL_TABLET | ORAL | Status: DC | PRN
  Administered 2020-09-22: 50 ug via ORAL
  Filled 2020-09-22: qty 1

## 2020-09-22 MED ORDER — MISOPROSTOL 25 MCG QUARTER TABLET: 25.0000 ug | ORAL_TABLET | ORAL | Status: DC | PRN

## 2020-09-22 MED ORDER — WITCH HAZEL-GLYCERIN EX PADS: 1.0000 "application " | MEDICATED_PAD | CUTANEOUS | Status: DC | PRN

## 2020-09-22 MED ORDER — LACTATED RINGERS IV SOLN: 500.0000 mL | Freq: Once | INTRAVENOUS | Status: DC

## 2020-09-22 MED ORDER — FENTANYL-BUPIVACAINE-NACL 0.5-0.125-0.9 MG/250ML-% EP SOLN
12.0000 mL/h | EPIDURAL | Status: DC | PRN
Start: 2020-09-22 — End: 2020-09-22

## 2020-09-22 MED ORDER — MEASLES, MUMPS & RUBELLA VAC IJ SOLR: 0.5000 mL | Freq: Once | INTRAMUSCULAR | Status: DC

## 2020-09-22 MED ORDER — COCONUT OIL OIL
1.0000 "application " | TOPICAL_OIL | Status: DC | PRN
  Administered 2020-09-23: 1 via TOPICAL

## 2020-09-22 MED ORDER — LIDOCAINE HCL (PF) 1 % IJ SOLN: 30.0000 mL | INTRAMUSCULAR | Status: DC | PRN

## 2020-09-22 MED ORDER — ACETAMINOPHEN 325 MG PO TABS
650.0000 mg | ORAL_TABLET | ORAL | Status: DC | PRN
  Administered 2020-09-23: 650 mg via ORAL
  Filled 2020-09-22: qty 2

## 2020-09-22 MED ORDER — DIPHENHYDRAMINE HCL 25 MG PO CAPS: 25.0000 mg | ORAL_CAPSULE | Freq: Four times a day (QID) | ORAL | Status: DC | PRN

## 2020-09-22 MED ORDER — DIBUCAINE (PERIANAL) 1 % EX OINT: 1.0000 "application " | TOPICAL_OINTMENT | CUTANEOUS | Status: DC | PRN

## 2020-09-22 MED ORDER — SENNOSIDES-DOCUSATE SODIUM 8.6-50 MG PO TABS
2.0000 | ORAL_TABLET | Freq: Every day | ORAL | Status: DC
  Administered 2020-09-23: 2 via ORAL
  Filled 2020-09-22: qty 2

## 2020-09-22 NOTE — Progress Notes (Signed)
Amanda Simmons is a 25 y.o. G2P1001 at [redacted]w[redacted]d admitted for induction of labor due to Post dates at [redacted]w[redacted]d  Subjective: Reports she is starting to feel her contractions. Reports ok with AROM at this time. She is still thinking about whether she wants an epidural  Objective: BP 119/74   Pulse 89   Temp 98.5 F (36.9 C) (Oral)   Resp 16   Ht 5\' 6"  (1.676 m)   Wt 70.7 kg   LMP 10/27/2019 (Approximate)   BMI 25.16 kg/m  No intake/output data recorded. No intake/output data recorded.  FHT:  FHR: 150 bpm, variability: moderate,  accelerations:  Present,  decelerations:  Absent UC:   regular, every 1-3 minutes SVE:   Dilation: 2.5 Effacement (%): 40 Station: -3 Exam by:: Dr. [email or web address]  Labs: Lab Results  Component Value Date   WBC 6.7 09/22/2020   HGB 9.7 (L) 09/22/2020   HCT 31.4 (L) 09/22/2020   MCV 82.0 09/22/2020   PLT 267 09/22/2020    Assessment / Plan: Induction of labor due to post dates  Labor: s/p cytotec x1, AROMed during this check Fetal Wellbeing:  Category I Pain Control:   Plans epidural I/D:   GBS negative Anticipated MOD:  NSVD  09/24/2020 09/22/2020, 7:22 PM

## 2020-09-22 NOTE — Progress Notes (Signed)
Labor Progress Note Amanda Simmons is a 25 y.o. G2P1001 at [redacted]w[redacted]d presented for post-dates induction of labor.  S: Feeling "pressure" lower in the pelvis now. Pain is manageable. Still planning for epidural as contractions get stronger.   O:  BP 112/69   Pulse 82   Temp 98.5 F (36.9 C) (Oral)   Resp 16   Ht 5\' 6"  (1.676 m)   Wt 70.7 kg   LMP 10/27/2019 (Approximate)   BMI 25.16 kg/m  EFM: 135/moderate variability/accels present  CVE: Dilation: 2.5 Effacement (%): 40 Cervical Position: Posterior Station: -3 Presentation: Vertex Exam by:: [email or web address], RN   A&P: 25 y.o. G2P1001 [redacted]w[redacted]d. #Labor: Not making dilatory change yet, but cervix is quite soft, expect thinning and dilation as contractions progress. Will re-dose cytotec, consider AROM at next check #Pain: PRN, planning for epidural #FWB: Cat I #GBS negative  [redacted]w[redacted]d, MD 5:03 PM

## 2020-09-22 NOTE — H&P (Signed)
OBSTETRIC ADMISSION HISTORY AND PHYSICAL  Amanda Simmons is a 25 y.o. female G2P1001 with IUP at [redacted]w[redacted]d by early u/s presenting for post-dates IOL. She reports +FMs, No LOF, no VB, no blurry vision, headaches or peripheral edema, and RUQ pain.  She plans on breast feeding. She requests POP for birth control. She received her prenatal care at  [facility]    Dating: By early u/s --->  Estimated Date of Delivery: 09/15/20  Sono:    @[redacted]w[redacted]d , CWD, normal anatomy, cephalic presentation, 2381g, EFW  She had no complications with her previous delivery.   Prenatal History/Complications: IUGR noted on early scan but resolved by 08/11/20 scan  Past Medical History: Past Medical History:  Diagnosis Date   ADD (attention deficit disorder)    Anxiety    Depression 08/16/2015   PHQ-9 was 11.      Past Surgical History: Past Surgical History:  Procedure Laterality Date   MOUTH SURGERY      Obstetrical History: OB History     Gravida  2   Para  1   Term  1   Preterm      AB      Living  1      SAB      IAB      Ectopic      Multiple  0   Live Births  1           Social History Social History   Socioeconomic History   Marital status: Single    Spouse name: Not on file   Number of children: Not on file   Years of education: Not on file   Highest education level: Not on file  Occupational History   Occupation: unemployed  Tobacco Use   Smoking status: Never   Smokeless tobacco: Former  10/17/2015 Use: Former   Substances: Nicotine, Flavoring   Devices: quit in July  Substance and Sexual Activity   Alcohol use: Yes    Alcohol/week: 0.0 standard drinks   Drug use: Yes    Types: Marijuana   Sexual activity: Yes    Partners: Male    Birth control/protection: None  Other Topics Concern   Not on file  Social History Narrative   Not on file   Social Determinants of Health   Financial Resource Strain: Not on file  Food Insecurity: Not  on file  Transportation Needs: Not on file  Physical Activity: Not on file  Stress: Not on file  Social Connections: Not on file    Family History: Family History  Problem Relation Age of Onset   Healthy Mother    Healthy Father    Diabetes Neg Hx    Heart disease Neg Hx    Hypertension Neg Hx    Stroke Neg Hx    Cancer Neg Hx     Allergies: Allergies  Allergen Reactions   Effexor [Venlafaxine] Nausea And Vomiting   Trintellix [Vortioxetine]     Made her mad/angry    Medications Prior to Admission  Medication Sig Dispense Refill Last Dose   Prenatal MV & Min w/FA-DHA (PRENATAL GUMMIES PO) Take by mouth.   Past Month   butalbital-acetaminophen-caffeine (FIORICET) 50-325-40 MG tablet Take 1-2 tablets by mouth every 6 (six) hours as needed for headache. (Patient not taking: No sig reported) 20 tablet 0    cetirizine (ZYRTEC) 10 MG tablet Take by mouth daily.      diphenhydrAMINE (BENADRYL) 25 MG tablet Take  25 mg by mouth every 6 (six) hours as needed. (Patient not taking: Reported on 09/16/2020)        Review of Systems   All systems reviewed and negative except as stated in HPI  Blood pressure 113/72, pulse 80, temperature (!) 97.3 F (36.3 C), temperature source Oral, resp. rate 18, last menstrual period 10/27/2019. General appearance: alert, cooperative, and appears stated age Lungs: normal work of breathing on room air, speaking in full sentences Heart: regular rate and rhythm Abdomen: gravid uterus, non-tender Extremities: Homans sign is negative, no sign of DVT Presentation: cephalic Fetal monitoringBaseline: 125 bpm, Variability: Good {> 6 bpm), and Accelerations: Reactive Uterine activity: rare spontaneous contractions      Prenatal labs: ABO, Rh: O/RH(D) POSITIVE/-- (01/31 1628) Antibody: NO ANTIBODIES DETECTED (01/31 1628) Rubella: 4.40 (01/31 1628) RPR: NON-REACTIVE (05/25 0000)  HBsAg: NON-REACTIVE (01/31 1628)  HIV: NON-REACTIVE (05/25 0000)  GBS:     1 hr Glucola: Normal Genetic screening  declined Anatomy US Mild bilateral UTD with limited views of other anatomy. Mild bilateral URD and growth at 11%. Resolved on f/u scans  Prenatal Transfer Tool  Maternal Diabetes: No Genetic Screening: Declined Maternal Ultrasounds/Referrals: IUGR- resolved by 08/11/20 Fetal Ultrasounds or other Referrals:  None Maternal Substance Abuse:  No Significant Maternal Medications:  None Significant Maternal Lab Results: Group B Strep negative  No results found for this or any previous visit (from the past 24 hour(s)).  Patient Active Problem List   Diagnosis Date Noted   Post term pregnancy at [redacted] weeks gestation 09/22/2020   IUGR (intrauterine growth restriction) affecting care of mother 07/29/2020   Atypical squamous cells cannot exclude high grade squamous intraepithelial lesion on cytologic smear of cervix (ASC-H) 03/14/2020   Supervision of other normal pregnancy, antepartum 02/29/2020   Epigastric pain 02/24/2020   Iron deficiency anemia 05/28/2019   OCD (obsessive compulsive disorder) 03/24/2018   GAD (generalized anxiety disorder) 03/28/2017   Constipation 08/16/2015   Depression 08/16/2015   Attention deficit hyperactivity disorder (ADHD) 01/17/2015   Allergic rhinitis 10/23/2009    Assessment/Plan:  Amanda Simmons is a 25 y.o. G2P1001 at [redacted]w[redacted]d here for Post-dates IOL.   #Labor:Having rare spontaneous contractions. At 2.5/50/-3 #Pain: PRN, does desire an epidural #FWB: Cat 1 #ID:  GBS neg #MOF: Breast #MOC: POP #Circ:  N/a baby girl  Amanda Gibbs, MD  09/22/2020, 12:37 PM

## 2020-09-22 NOTE — Discharge Summary (Addendum)
Postpartum Discharge Summary    Patient Name: Amanda Simmons DOB: December 24, 1995 MRN: [id number]  Date of admission: 09/22/2020 Delivery date:09/22/2020  Delivering provider: Genia Del  Date of discharge: 09/23/2020  Admitting diagnosis: Post term pregnancy at [redacted] weeks gestation [O48.0, Z3A.41] Intrauterine pregnancy: [redacted]w[redacted]d    Secondary diagnosis:  Active Problems:   Iron deficiency anemia   Post term pregnancy at [redacted] weeks gestation  Additional problems: None    Discharge diagnosis: Term Pregnancy Delivered                                              Post partum procedures: None Augmentation: AROM and Cytotec Complications: None  Hospital course: Induction of Labor With Vaginal Delivery   25 y.o. yo G2P1001 at 44w0das admitted to the hospital 09/22/2020 for induction of labor.  Indication for induction: Postdates.  Patient had an uncomplicated labor course as follows: Membrane Rupture Time/Date: 5:32 PM ,09/22/2020   Delivery Method:Vaginal, Spontaneous  Episiotomy:   Lacerations:  1st degree;Periurethral  Details of delivery can be found in separate delivery note.  Patient had a routine postpartum course. Patient is discharged home 09/23/20.  Newborn Data: Birth date:09/22/2020  Birth time:8:37 PM  Gender:Female  Living status:Living  Apgars:8 ,9  Weight:3629 g   Magnesium Sulfate received: No BMZ received: No Rhophylac: N/A MMR: N/A - Immune  T-DaP: Declined Flu: Declined Transfusion:No  Physical exam  Vitals:   09/22/20 2216 09/22/20 2240 09/22/20 2339 09/23/20 0345  BP: 123/83 119/77 124/82 109/78  Pulse: 90 77 81 75  Resp:  17  16  Temp:  97.8 F (36.6 C) 97.6 F (36.4 C) 98 F (36.7 C)  TempSrc:  Oral Oral Oral  SpO2:  100% 100% 99%  Weight:      Height:       General: alert, cooperative, and no distress Lochia: appropriate Uterine Fundus: firm Incision: N/A DVT Evaluation: no LE edema or calf tenderness to palpation  Labs: Lab  Results  Component Value Date   WBC 6.7 09/22/2020   HGB 9.7 (L) 09/22/2020   HCT 31.4 (L) 09/22/2020   MCV 82.0 09/22/2020   PLT 267 09/22/2020   CMP Latest Ref Rng & Units 12/25/2019  Glucose 65 - 99 mg/dL 63(L)  BUN 7 - 25 mg/dL 10  Creatinine 0.50 - 1.10 mg/dL 0.70  Sodium 135 - 146 mmol/L 141  Potassium 3.5 - 5.3 mmol/L 3.7  Chloride 98 - 110 mmol/L 105  CO2 20 - 32 mmol/L 25  Calcium 8.6 - 10.2 mg/dL 9.4  Total Protein 6.1 - 8.1 g/dL 7.4  Total Bilirubin 0.2 - 1.2 mg/dL 0.8  AST 10 - 30 U/L 13  ALT 6 - 29 U/L 7   Edinburgh Score: Edinburgh Postnatal Depression Scale Screening Tool 09/22/2020  I have been able to laugh and see the funny side of things. (No Data)     After visit meds:  Allergies as of 09/23/2020       Reactions   Effexor [venlafaxine] Nausea And Vomiting   Trintellix [vortioxetine]    Made her mad/angry        Medication List     STOP taking these medications    butalbital-acetaminophen-caffeine 50-325-40 MG tablet Commonly known as: FIORICET   diphenhydrAMINE 25 MG tablet Commonly known as: BENADRYL  TAKE these medications    acetaminophen 325 MG tablet Commonly known as: Tylenol Take 2 tablets (650 mg total) by mouth every 4 (four) hours as needed (for pain scale < 4).   cetirizine 10 MG tablet Commonly known as: ZYRTEC Take by mouth daily.   coconut oil Oil Apply 1 application topically as needed.   ibuprofen 600 MG tablet Commonly known as: ADVIL Take 1 tablet (600 mg total) by mouth every 6 (six) hours.   norethindrone 0.35 MG tablet Commonly known as: Ortho Micronor Take 1 tablet (0.35 mg total) by mouth daily.   PRENATAL GUMMIES PO Take by mouth.         Discharge home in stable condition Infant Feeding: Bottle and Breast Infant Disposition: home with mother Discharge instruction: per After Visit Summary and Postpartum booklet. Activity: Advance as tolerated. Pelvic rest for 6 weeks.  Diet: routine  diet Future Appointments:No future appointments. Follow up Visit: Message sent by Dr. Gwenlyn Perking on 8/18.  Please schedule this patient for a In person postpartum visit in 4 weeks with the following provider: Any provider. Additional Postpartum F/U: N/A   Low risk pregnancy complicated by:  N/A Delivery mode:  Vaginal, Spontaneous  Anticipated Birth Control:  POPs   GME ATTESTATION:  I saw and evaluated the patient. I agree with the findings and the plan of care as documented in the resident's note. I have made changes to documentation as necessary.  Vilma Meckel, MD OB Fellow, [facility] 09/23/2020 9:17 AM

## 2020-09-22 NOTE — Lactation Note (Signed)
This note was copied from a baby's chart. Lactation Consultation Note  Patient Name: Amanda Simmons [id number] Date: 09/22/2020 Reason for consult: L&D Initial assessment;Term Age:24 hours Baby less than hr old at time of consult starting. Baby fussy cueing. Mouth is very wet. LC used bulb syring to suction and clear baby's mouth. Mom has flat compressible nipples. Baby latches but will not hold in mouth. Baby off and on frequently.  Baby finally latched and suckling well w/o popping off. Mom was laid back to check her. Baby BF well.  Mom has 57 yr old that she only BF for 3 days. Will f/u on MBU.  Maternal Data Does the patient have breastfeeding experience prior to this delivery?: Yes How long did the patient breastfeed?: 3 days  Feeding    LATCH Score Latch: Repeated attempts needed to sustain latch, nipple held in mouth throughout feeding, stimulation needed to elicit sucking reflex.  Audible Swallowing: None  Type of Nipple: Flat  Comfort (Breast/Nipple): Soft / non-tender  Hold (Positioning): Assistance needed to correctly position infant at breast and maintain latch.  LATCH Score: 5   Lactation Tools Discussed/Used    Interventions Interventions: Breast feeding basics reviewed;Support pillows;Assisted with latch;Position options;Skin to skin;Breast massage;Breast compression;Adjust position  Discharge    Consult Status Consult Status: Follow-up from L&D Date: 09/23/20 Follow-up type: In-patient    Charyl Dancer 09/22/2020, 9:45 PM

## 2020-09-23 LAB — RPR: RPR Ser Ql: NONREACTIVE

## 2020-09-23 MED ORDER — COCONUT OIL OIL: 1.0000 "application " | TOPICAL_OIL | 0 refills | Status: DC | PRN

## 2020-09-23 MED ORDER — ACETAMINOPHEN 325 MG PO TABS: 650.0000 mg | ORAL_TABLET | ORAL | Status: DC | PRN

## 2020-09-23 MED ORDER — IBUPROFEN 600 MG PO TABS: 600.0000 mg | ORAL_TABLET | Freq: Four times a day (QID) | ORAL | 0 refills | Status: DC

## 2020-09-23 MED ORDER — NORETHINDRONE 0.35 MG PO TABS: 1.0000 | ORAL_TABLET | Freq: Every day | ORAL | 11 refills | Status: DC

## 2020-09-23 NOTE — Lactation Note (Signed)
This note was copied from a baby's chart. Lactation Consultation Note  Patient Name: Amanda Simmons [id number] Date: 09/23/2020 Reason for consult: Initial assessment Age:24 hours P2, Mother reports that with her first child she attempt to breastfed in the hospital but was unsuccessful. She became engorged and infant was jaundice.  Mother reports that she hopes to continue to breastfeed this infant without problems.  Discussed getting LC assistance early and seeing OP. She was given a LC brochure with OP number and LC office for follow up phone calls   Mother reports that infant just finished a 40 min feeding . She denies having any pain and reports that she is hearing infant swallow.  Discussed continued cue base feeding, freq. STS and cluster feeding.  Mother to page for [facility] to check latch.   Maternal Data Has patient been taught Hand Expression?: Yes Does the patient have breastfeeding experience prior to this delivery?: Yes How long did the patient breastfeed?: short attempt  Feeding Mother's Current Feeding Choice: Breast Milk  LATCH Score                    Lactation Tools Discussed/Used    Interventions Interventions: Skin to skin;Hand express;Education;Breast feeding basics reviewed  Discharge Pump: Personal (Even Flow)  Consult Status Consult Status: Follow-up Date: 09/23/20 Follow-up type: In-patient    Stevan Born [facility] 09/23/2020, 9:51 AM

## 2020-09-23 NOTE — Lactation Note (Signed)
This note was copied from a baby's chart. Lactation Consultation Note  Patient Name: Amanda Simmons [id number] Date: 09/23/2020 Reason for consult: Initial assessment Age:24 hours Mother paged for assistance with latch. Mother reports that she has some itching on the tip of her RT nipple. Observed tiny fld filled blisters. Advised mother to hand express and change positions.  Worked with mother on better depth.  Infant latched on alternate breast in cross cradle hold. Observed feeding for 10 mins. Lips widely flanged. Teaching at this time. Father at the bedside for support.  Maternal Data    Feeding Mother's Current Feeding Choice: Breast Milk  LATCH Score Latch: Grasps breast easily, tongue down, lips flanged, rhythmical sucking.  Audible Swallowing: Spontaneous and intermittent  Type of Nipple: Everted at rest and after stimulation  Comfort (Breast/Nipple): Soft / non-tender  Hold (Positioning): Assistance needed to correctly position infant at breast and maintain latch.  LATCH Score: 9   Lactation Tools Discussed/Used    Interventions    Discharge    Consult Status Consult Status: Follow-up Date: 09/24/20 Follow-up type: In-patient    Stevan Born [facility] 09/23/2020, 2:52 PM

## 2020-09-23 NOTE — Clinical Social Work Maternal (Signed)
CLINICAL SOCIAL WORK MATERNAL/CHILD NOTE  Patient Details  Name: Amanda Simmons MRN: [id number] Date of Birth: 1995/03/17  Date:  09/23/2020  Clinical Social Worker Initiating Note:  Kathrin Greathouse, [place] Date/Time: Initiated:  09/23/20/      Child's Name:  [place] Parents:  Mother, Father Cherlyn Roberts- 07-07-1995)   Need for Interpreter:  None   Reason for Referral:      Address:  [place] [address]    Phone number:  [phone] (home)     Additional phone number:   Household Members/Support Persons (HM/SP):   Household Member/Support Person 1, Household Member/Support Person 2   HM/SP Name Relationship DOB or Age  HM/SP -[place] FOB 07-07-1995  HM/SP -[place] Daughter 5  HM/SP -3        HM/SP -4        HM/SP -5        HM/SP -6        HM/SP -7        HM/SP -8          Natural Supports (not living in the home):  Parent, Extended Family   Professional Supports: None   Employment: Full-time   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, maintenance   Education:  Attending college   Homebound arranged:    Museum/gallery curator Resources:  Multimedia programmer    Other Resources:  [facility]   Cultural/Religious Considerations Which May Impact Care:    Strengths:  Ability to meet basic needs  , Home prepared for child  , Pediatrician chosen   Psychotropic Medications:         Pediatrician:    [place] (including [place])  Pediatrician List:   [facility]      Pediatrician Fax Number:    Risk Factors/Current Problems:  Substance Use  , Mental Health Concerns     Cognitive State:  Linear Thinking  , Insightful     Mood/Affect:  Bright  , Calm     CSW Assessment: CSW received consult for hx of Anxiety and Depression.  CSW met with MOB to offer support and complete  assessment.     CSW met with MOB at bedside. CSW congratulated MOB and FOB. CSW observed MOB up in room and FOB siting on bed holding the infant. CSW offered MOB privacy. MOB stated that CSW could talk and share information in front of FOB. CSW inquired how MOB has felt since giving birth. MOB shared she has felt good. CSW inquired about MOB history of anxiety, depression and ADHD. MOB acknowledged the diagnosis and reported she has not been on medication before to help however she is not currently taking them. MOB reported she has been managing well since enrolling in school for nursing and focusing her education. CSW praised MOB for her efforts. CSW inquired if MOB had experienced postpartum depression. MOB reported she experienced postpartum depression symptoms after the birth of her older child as evidenced by insolating from her family and crying lot. MOB shared at that time she had several social stressors that impacted her emotionally. MOB reported she no longer has those stressors. CSW provided education regarding the baby blues period vs. perinatal mood disorders, discussed treatment and gave resources for mental health follow up if concerns arise.  CSW recommended MOB complete a self-evaluation during the postpartum time period using the New Mom Checklist from Postpartum Progress and encouraged MOB to contact a medical professional if symptoms are noted at any time. CSW assessed MOB for safety. MOb denied thoughts of harm to self and others.   CSW inquired about MOB positive drug screen during her first prenatal visit. MOB reported she has not used THC since learning she was pregnant. CSW informed MOB about the hospital drug screen policy. CSW will follow infant's CDS and make a report to CPS if warranted. MOB reported understanding and again reported no use since finding out she was pregnant.  CSW provided review of Sudden Infant Death Syndrome (SIDS) precautions. MOB shared she all essential items  for the infant including a bassinet where the infant will safe sleep. MOB has chosen [facility] in [place] for infant's follow up care.   -CSW will follow CDS.   CSW identifies no further need for intervention and no barriers to discharge at this time.    CSW Plan/Description:  Perinatal Mood and Anxiety Disorder (PMADs) Education, Sudden Infant Death Syndrome (SIDS) Education, CSW Will Continue to Monitor Umbilical Cord Tissue Drug Screen Results and Make Report if [facility] [place], No Further Intervention Required/No Barriers to Discharge    Lia Hopping, LCSW 09/23/2020, 3:07PM

## 2020-09-26 ENCOUNTER — Telehealth: Payer: Self-pay | Admitting: *Deleted

## 2020-09-26 NOTE — Telephone Encounter (Signed)
Transition Care Management Follow-up Telephone Call Date of discharge and from where: 09/23/2020 - [facility] How have you been since you were released from the hospital? "Fine" Any questions or concerns? No  Items Reviewed: Did the pt receive and understand the discharge instructions provided? Yes  Medications obtained and verified? Yes  Other? No  Any new allergies since your discharge? No  Dietary orders reviewed? No Do you have support at home? Yes    Functional Questionnaire: (I = Independent and D = Dependent) ADLs: I  Bathing/Dressing- I  Meal Prep- I  Eating- I  Maintaining continence- I  Transferring/Ambulation- I  Managing Meds- I  Follow up appointments reviewed:  PCP Hospital f/u appt confirmed? No   Specialist Hospital f/u appt confirmed? No  - advised her that she needs to call her OBGYN to make a follow up appointment Are transportation arrangements needed? No  If their condition worsens, is the pt aware to call PCP or go to the Emergency Dept.? Yes Was the patient provided with contact information for the PCP's office or ED? Yes Was to pt encouraged to call back with questions or concerns? Yes

## 2020-10-03 ENCOUNTER — Telehealth (HOSPITAL_COMMUNITY): Payer: Self-pay | Admitting: *Deleted

## 2020-10-03 NOTE — Telephone Encounter (Signed)
Attempted hospital discharge follow-up call. Left message for patient to return RN call. Deforest Hoyles, RN, 10/03/20, [phone].

## 2020-10-18 NOTE — Progress Notes (Signed)
    Post Partum Visit Note  Amanda Simmons is a 25 y.o. G45P1001 female who presents for a postpartum visit. She is 4 weeks postpartum following an IOL for post dates.   She delivered a vaginal birth. I have fully reviewed the prenatal and intrapartum course. The delivery was at 41 gestational weeks.  Anesthesia: none. Postpartum course has been unremarkable. Baby is doing well. Baby is feeding by breast. Bleeding no bleeding. Bowel function is normal. Bladder function is normal. Patient is sexually active. Contraception method is oral progesterone-only contraceptive. Postpartum depression screening: negative.   The pregnancy intention screening data noted above was reviewed. Potential methods of contraception were discussed. The patient elected to proceed with No data recorded.    Health Maintenance Due  Topic Date Due   COVID-19 Vaccine (1) Never done   HPV VACCINES (1 - 2-dose series) Never done   INFLUENZA VACCINE  Never done    The following portions of the patient's history were reviewed and updated as appropriate: allergies, current medications, past family history, past medical history, past social history, past surgical history, and problem list.  Review of Systems Pertinent items are noted in HPI.  Objective:  LMP 10/27/2019 (Approximate)    General:  alert, cooperative, and no distress   Breasts:  not indicated  Lungs: Not examined  Heart:  regular rate and rhythm  Abdomen: soft, non-tender; bowel sounds normal; no masses,  no organomegaly   Wound Perineum is well healed  GU exam:  normal       Assessment:    There are no diagnoses linked to this encounter.  4 week postpartum exam.   Plan:   Essential components of care per ACOG recommendations:  1.  Mood and well being: Patient with negative depression screening today. Reviewed local resources for support.  - Patient tobacco use? No.   - hx of drug use? No.    2. Infant care and feeding:  -Patient  currently breastmilk feeding? Yes. Reviewed importance of draining breast regularly to support lactation.  -Social determinants of health (SDOH) reviewed in EPIC. No concerns  3. Sexuality, contraception and birth spacing - Patient does not want a pregnancy in the next year.  Desired family size is 3 children at most.  - Reviewed forms of contraception in tiered fashion. Patient desired oral progesterone-only contraceptive today.   - Discussed birth spacing of 18 months  4. Sleep and fatigue -Encouraged family/partner/community support of 4 hrs of uninterrupted sleep to help with mood and fatigue  5. Physical Recovery  - Discussed patients delivery and complications. She describes her labor as mixed. She went quickly and had an easy delivery but was unable to get an epidural which she had wanted. However she dilated too quickly for it - Patient had a Vaginal, no problems at delivery. Patient had a  periurethral  laceration. Perineal healing reviewed. Patient expressed understanding - Patient has urinary incontinence? No. - Patient is safe to resume physical and sexual activity  6.  Health Maintenance - HM due items addressed Yes - Last pap smear  Diagnosis  Date Value Ref Range Status  03/07/2020 (A)  Final   - Atypical squamous cells, cannot exclude high grade squamous  03/07/2020 intraepithelial lesion (ASC-H) (A)  Final   Pap smear done at today's visit.  -Breast Cancer screening indicated? No.   7. Chronic Disease/Pregnancy Condition follow up: Abnormal pap smear  - PCP follow up  Milas Hock, MD

## 2020-10-20 ENCOUNTER — Ambulatory Visit (INDEPENDENT_AMBULATORY_CARE_PROVIDER_SITE_OTHER): Payer: 59 | Admitting: Obstetrics and Gynecology

## 2020-10-20 ENCOUNTER — Other Ambulatory Visit (HOSPITAL_COMMUNITY)
Admission: RE | Admit: 2020-10-20 | Discharge: 2020-10-20 | Disposition: A | Discharge status: Home / Self Care | Payer: 59 | Source: Ambulatory Visit | Attending: Obstetrics and Gynecology | Admitting: Obstetrics and Gynecology

## 2020-10-20 ENCOUNTER — Encounter: Payer: Self-pay | Admitting: Obstetrics and Gynecology

## 2020-10-20 ENCOUNTER — Other Ambulatory Visit: Payer: Self-pay

## 2020-10-20 VITALS — BP 106/65 | HR 81 | Ht 66.0 in | Wt 130.0 lb

## 2020-10-20 DIAGNOSIS — Z124 Encounter for screening for malignant neoplasm of cervix: Secondary | ICD-10-CM | POA: Diagnosis not present

## 2020-10-20 MED ORDER — NORETHINDRONE 0.35 MG PO TABS: 1.0000 | ORAL_TABLET | Freq: Every day | ORAL | 1 refills | Status: DC

## 2020-10-20 NOTE — Addendum Note (Signed)
Addended by: Milas Hock A on: 10/20/2020 10:47 AM   Modules accepted: Orders

## 2020-10-26 LAB — CYTOLOGY - PAP
Comment: NEGATIVE
Diagnosis: HIGH — AB
High risk HPV: NEGATIVE

## 2020-11-13 NOTE — Progress Notes (Signed)
    GYNECOLOGY OFFICE COLPOSCOPY PROCEDURE NOTE  25 y.o. [id number] here for colposcopy for abnormal pap smear:  02/2020: pap was ASC-H in pregnancy 9/22: ASC-H, HPV negative.   Pregnancy test:  negative Gardasil:  completed. Discussed role for HPV in cervical dysplasia, need for surveillance.  Informed consent and review of risks, benefit and alternatives performed. Written consent given.   Speculum inserted into patient's vagina assuring full view of cervix and vaginal walls. 3 swabs of vinegar solution applied to the cervix and vaginal walls and colposcope was used to observe both the cervix and vaginal walls.   Colposcopy adequate? Yes  no visible lesions; random biopsy done at 12 oclock.  ECC collected.  All specimens were labeled and sent to pathology.  Monsel's applied to biopsy sites for good hemostasis and speculum removed.  Pt tolerated well with minimal pain and bleeding.   Patient was given post procedure instructions.  Will follow up pathology and manage accordingly; patient will be contacted with results and recommendations.  Routine preventative health maintenance measures emphasized.  She also noted vaginal odor -  will check urine cx and vaginal cx. Mervyn Gay, RN noted odor to urine. Will empirically treat with flagyl until cx results come back  Milas Hock, MD, FACOG Obstetrician & Gynecologist, [facility] for [employer], [facility]

## 2020-11-17 ENCOUNTER — Other Ambulatory Visit (HOSPITAL_COMMUNITY)
Admission: RE | Admit: 2020-11-17 | Discharge: 2020-11-17 | Disposition: A | Discharge status: Home / Self Care | Payer: 59 | Source: Ambulatory Visit | Attending: Obstetrics and Gynecology | Admitting: Obstetrics and Gynecology

## 2020-11-17 ENCOUNTER — Ambulatory Visit (INDEPENDENT_AMBULATORY_CARE_PROVIDER_SITE_OTHER): Payer: 59 | Admitting: Obstetrics and Gynecology

## 2020-11-17 ENCOUNTER — Other Ambulatory Visit: Payer: Self-pay

## 2020-11-17 ENCOUNTER — Encounter: Payer: Self-pay | Admitting: Obstetrics and Gynecology

## 2020-11-17 VITALS — BP 106/66 | HR 62 | Resp 16 | Ht 66.0 in | Wt 133.0 lb

## 2020-11-17 DIAGNOSIS — R87611 Atypical squamous cells cannot exclude high grade squamous intraepithelial lesion on cytologic smear of cervix (ASC-H): Secondary | ICD-10-CM

## 2020-11-17 DIAGNOSIS — L750 Bromhidrosis: Secondary | ICD-10-CM

## 2020-11-17 DIAGNOSIS — Z3202 Encounter for pregnancy test, result negative: Secondary | ICD-10-CM

## 2020-11-17 LAB — POCT URINE PREGNANCY: Preg Test, Ur: NEGATIVE

## 2020-11-17 MED ORDER — METRONIDAZOLE 500 MG PO TABS: 500.0000 mg | ORAL_TABLET | Freq: Two times a day (BID) | ORAL | 0 refills | Status: DC

## 2020-11-17 NOTE — Addendum Note (Signed)
Addended by: Granville Lewis on: 11/17/2020 10:01 AM   Modules accepted: Orders

## 2020-11-18 LAB — SURGICAL PATHOLOGY

## 2020-11-18 LAB — CERVICOVAGINAL ANCILLARY ONLY
Bacterial Vaginitis (gardnerella): POSITIVE — AB
Candida Glabrata: NEGATIVE
Candida Vaginitis: NEGATIVE
Comment: NEGATIVE
Comment: NEGATIVE
Comment: NEGATIVE
Comment: NEGATIVE
Trichomonas: NEGATIVE

## 2020-11-19 LAB — URINE CULTURE
MICRO NUMBER:: 12502154
Result:: NO GROWTH
SPECIMEN QUALITY:: ADEQUATE

## 2021-08-14 DIAGNOSIS — L409 Psoriasis, unspecified: Secondary | ICD-10-CM | POA: Insufficient documentation

## 2021-10-23 ENCOUNTER — Ambulatory Visit: Payer: 59 | Admitting: Obstetrics & Gynecology

## 2021-11-06 ENCOUNTER — Other Ambulatory Visit (HOSPITAL_COMMUNITY)
Admission: RE | Admit: 2021-11-06 | Discharge: 2021-11-06 | Disposition: A | Discharge status: Home / Self Care | Payer: 59 | Source: Ambulatory Visit | Attending: Obstetrics & Gynecology | Admitting: Obstetrics & Gynecology

## 2021-11-06 ENCOUNTER — Encounter: Payer: Self-pay | Admitting: Obstetrics & Gynecology

## 2021-11-06 ENCOUNTER — Ambulatory Visit (INDEPENDENT_AMBULATORY_CARE_PROVIDER_SITE_OTHER): Payer: 59 | Admitting: Obstetrics & Gynecology

## 2021-11-06 VITALS — BP 120/75 | HR 85 | Resp 16 | Ht 66.0 in | Wt 132.0 lb

## 2021-11-06 DIAGNOSIS — Z01419 Encounter for gynecological examination (general) (routine) without abnormal findings: Secondary | ICD-10-CM | POA: Diagnosis not present

## 2021-11-06 DIAGNOSIS — R87611 Atypical squamous cells cannot exclude high grade squamous intraepithelial lesion on cytologic smear of cervix (ASC-H): Secondary | ICD-10-CM | POA: Insufficient documentation

## 2021-11-06 NOTE — Progress Notes (Unsigned)
Subjective:     Amanda Simmons is a 26 y.o. female here for a routine exam.  Current complaints: none.  She is still breast feeding and doing well.     Gynecologic History No LMP recorded. Contraception: oral progesterone-only contraceptive Last Pap: CIN 1 biospy 11/2020 Last mammogram: n/a  Obstetric History OB History  Gravida Para Term Preterm AB Living  2 2 2     2   SAB IAB Ectopic Multiple Live Births        0 2    # Outcome Date GA Lbr Len/2nd Weight Sex Delivery Anes PTL Lv  2 Term 09/22/20 [redacted]w[redacted]d  8 lb (3.629 kg) F Vag-Spont None  LIV  1 Term 04/14/16 [redacted]w[redacted]d 11:44 / 02:00 8 lb 10.8 oz (3.935 kg) F Vag-Spont EPI  LIV     The following portions of the patient's history were reviewed and updated as appropriate: allergies, current medications, past family history, past medical history, past social history, past surgical history, and problem list.  Review of Systems Pertinent items noted in HPI and remainder of comprehensive ROS otherwise negative.    Objective:     Vitals:   11/06/21 1353  BP: 120/75  Pulse: 85  Resp: 16  Weight: 132 lb (59.9 kg)  Height: 5\' 6"  (1.676 m)   Vitals:  WNL General appearance: alert, cooperative and no distress  HEENT: Normocephalic, without obvious abnormality, atraumatic Eyes: negative Throat: lips, mucosa, and tongue normal; teeth and gums normal  Respiratory: Clear to auscultation bilaterally  CV: Regular rate and rhythm  Breasts:  Normal appearance, no masses or tenderness, no nipple retraction or dimpling  GI: Soft, non-tender; bowel sounds normal; no masses,  no organomegaly  GU: External Genitalia:  Tanner V, no lesion Urethra:  No prolapse   Vagina: Pink, normal rugae, no blood or discharge  Cervix: No CMT, no lesion  Uterus:  Normal size and contour, non tender  Adnexa: Normal, no masses, non tender  Musculoskeletal: No edema, redness or tenderness in the calves or thighs  Skin: No lesions or rash  Lymphatic:  Axillary adenopathy: none     Psychiatric: Normal mood and behavior        Assessment:    Healthy female exam.  CIN 1 in 2022   Plan:    1.  Pap with co testing 2.  Will change from Micronor to combination oral contraceptives for better efficacy. 3.  RTC in 1 year or sooner

## 2021-11-08 MED ORDER — NORETHIN ACE-ETH ESTRAD-FE 1-20 MG-MCG(24) PO TABS: 1.0000 | ORAL_TABLET | Freq: Every day | ORAL | 11 refills | Status: DC

## 2021-11-13 ENCOUNTER — Telehealth: Payer: Self-pay

## 2021-11-13 LAB — CYTOLOGY - PAP
Comment: NEGATIVE
High risk HPV: POSITIVE — AB

## 2021-11-13 NOTE — Telephone Encounter (Signed)
Pt returning office call. Pt was told she needs to schedule colposcopy. Pt states she will call back tomorrow once she has her work schedule.

## 2021-12-06 ENCOUNTER — Telehealth: Payer: Self-pay | Admitting: *Deleted

## 2021-12-06 NOTE — Telephone Encounter (Signed)
Left patient an urgent message to call and schedule Colpo as soon as possible with Dr. Gala Romney.

## 2022-01-01 ENCOUNTER — Encounter: Payer: 59 | Admitting: Obstetrics & Gynecology

## 2022-01-24 ENCOUNTER — Other Ambulatory Visit: Payer: Self-pay | Admitting: *Deleted

## 2022-01-24 MED ORDER — FLUCONAZOLE 150 MG PO TABS: ORAL_TABLET | ORAL | 0 refills | Status: DC

## 2022-01-24 NOTE — Progress Notes (Signed)
Pt called stating that she is having severe vaginal itching and white cottage cheese D/C.  She is requesting Diflucan.  Pt does have a h/o vaginal yeast.  RX sent to her pharmacy per Dr Bertram Denver previous notes.

## 2022-07-09 ENCOUNTER — Other Ambulatory Visit (HOSPITAL_COMMUNITY)
Admission: RE | Admit: 2022-07-09 | Discharge: 2022-07-09 | Disposition: A | Discharge status: Home / Self Care | Payer: Medicaid Other | Source: Ambulatory Visit | Attending: Obstetrics and Gynecology | Admitting: Obstetrics and Gynecology

## 2022-07-09 ENCOUNTER — Ambulatory Visit (INDEPENDENT_AMBULATORY_CARE_PROVIDER_SITE_OTHER): Payer: Medicaid Other

## 2022-07-09 DIAGNOSIS — N898 Other specified noninflammatory disorders of vagina: Secondary | ICD-10-CM

## 2022-07-09 NOTE — Progress Notes (Signed)
Pt is having thick, white vaginal discharge and vaginal itching. Pt denies UTI symptoms. Pt declines STI testing. Pt is aware we will contact with results.

## 2022-07-10 ENCOUNTER — Other Ambulatory Visit: Payer: Self-pay

## 2022-07-10 DIAGNOSIS — N898 Other specified noninflammatory disorders of vagina: Secondary | ICD-10-CM

## 2022-07-10 LAB — CERVICOVAGINAL ANCILLARY ONLY
Comment: NEGATIVE
Comment: NEGATIVE
Comment: NEGATIVE

## 2022-07-10 MED ORDER — FLUCONAZOLE 150 MG PO TABS: 150.0000 mg | ORAL_TABLET | Freq: Once | ORAL | 0 refills | Status: AC

## 2022-07-10 NOTE — Progress Notes (Signed)
Diflucan sent per Dr Constant. 

## 2022-07-12 ENCOUNTER — Other Ambulatory Visit: Payer: Self-pay

## 2022-07-12 DIAGNOSIS — N898 Other specified noninflammatory disorders of vagina: Secondary | ICD-10-CM

## 2022-07-12 MED ORDER — FLUCONAZOLE 150 MG PO TABS: 150.0000 mg | ORAL_TABLET | Freq: Once | ORAL | 0 refills | Status: AC

## 2022-07-12 NOTE — Progress Notes (Signed)
One more Diflucan sent to treat yeast infection

## 2022-11-06 ENCOUNTER — Other Ambulatory Visit (HOSPITAL_COMMUNITY)
Admission: RE | Admit: 2022-11-06 | Discharge: 2022-11-06 | Disposition: A | Discharge status: Home / Self Care | Payer: Medicaid Other | Source: Ambulatory Visit | Attending: Obstetrics and Gynecology | Admitting: Obstetrics and Gynecology

## 2022-11-06 ENCOUNTER — Other Ambulatory Visit: Payer: Self-pay | Admitting: Obstetrics & Gynecology

## 2022-11-06 ENCOUNTER — Ambulatory Visit: Payer: Medicaid Other

## 2022-11-06 DIAGNOSIS — N898 Other specified noninflammatory disorders of vagina: Secondary | ICD-10-CM | POA: Insufficient documentation

## 2022-11-06 NOTE — Progress Notes (Signed)
Pt here for self swab due to vaginal itching. Pt denies UTI symptoms. Pt is aware we will contact with results.

## 2022-11-07 LAB — CERVICOVAGINAL ANCILLARY ONLY
Bacterial Vaginitis (gardnerella): NEGATIVE
Candida Glabrata: NEGATIVE
Candida Vaginitis: POSITIVE — AB
Comment: NEGATIVE
Comment: NEGATIVE
Comment: NEGATIVE

## 2022-11-08 ENCOUNTER — Other Ambulatory Visit: Payer: Self-pay

## 2022-11-08 DIAGNOSIS — B379 Candidiasis, unspecified: Secondary | ICD-10-CM

## 2022-11-08 MED ORDER — FLUCONAZOLE 150 MG PO TABS
150.0000 mg | ORAL_TABLET | Freq: Once | ORAL | 0 refills | Status: AC
Start: 2022-11-08 — End: 2022-11-08

## 2022-11-30 ENCOUNTER — Other Ambulatory Visit: Payer: Self-pay | Admitting: Obstetrics & Gynecology

## 2023-02-06 NOTE — L&D Delivery Note (Signed)
 OB/GYN Faculty Practice Delivery Note  Amanda Simmons is a 28 y.o. G3P3003 s/p vag delivery at [redacted]w[redacted]d. She was admitted for IOL postdates.   ROM: 3h 74m with copious, clear fluid GBS Status: neg Maximum Maternal Temperature: 98.3  Labor Progress: Ms Preston was admitted on the morning of 05/07/23 and was started on Pitocin; she later requested AROM prior to getting into active labor and progressing to vag delivery.  Delivery Date/Time: April 1st, 2025 at 1532 Delivery: Called to room and patient was complete and pushing. Head delivered LOA. Nuchal cord present x 1; reduced after delivery. Shoulder and body delivered in usual fashion. Infant with spontaneous cry, placed on mother's abdomen, dried and stimulated. Cord clamped x 2 after 1-minute delay, and cut by FOB. Cord blood drawn. Placenta delivered spontaneously with gentle cord traction. Fundus firm with massage and Pitocin. Labia, perineum, vagina, and cervix inspected and found to be intact.   Placenta: spont, intact; to L&D Complications: none Lacerations: none EBL: 11cc Analgesia: none  Postpartum Planning [x]  message to sent to schedule follow-up, including MD visit for interval BTL planning  Infant: boy  APGARs 8/9  3910g (8lb 9.9oz)  [name], CNM  05/07/2023 4:25 PM

## 2023-02-28 ENCOUNTER — Telehealth: Payer: Self-pay
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
## 2023-03-04 ENCOUNTER — Encounter: Payer: Self-pay | Admitting: Obstetrics and Gynecology

## 2023-03-04 DIAGNOSIS — O0990 Supervision of high risk pregnancy, unspecified, unspecified trimester: Secondary | ICD-10-CM | POA: Insufficient documentation

## 2023-03-04 DIAGNOSIS — O0930 Supervision of pregnancy with insufficient antenatal care, unspecified trimester: Secondary | ICD-10-CM | POA: Insufficient documentation

## 2023-03-05 ENCOUNTER — Ambulatory Visit: Payer: Medicaid Other | Admitting: Obstetrics and Gynecology

## 2023-03-05 VITALS — BP 108/67 | HR 88 | Wt 143.0 lb

## 2023-03-05 DIAGNOSIS — O0933 Supervision of pregnancy with insufficient antenatal care, third trimester: Secondary | ICD-10-CM | POA: Diagnosis not present

## 2023-03-05 DIAGNOSIS — Z3A31 31 weeks gestation of pregnancy: Secondary | ICD-10-CM

## 2023-03-05 DIAGNOSIS — R87629 Unspecified abnormal cytological findings in specimens from vagina: Secondary | ICD-10-CM

## 2023-03-05 DIAGNOSIS — O0990 Supervision of high risk pregnancy, unspecified, unspecified trimester: Secondary | ICD-10-CM

## 2023-03-05 DIAGNOSIS — O0930 Supervision of pregnancy with insufficient antenatal care, unspecified trimester: Secondary | ICD-10-CM

## 2023-03-05 DIAGNOSIS — Z302 Encounter for sterilization: Secondary | ICD-10-CM | POA: Insufficient documentation

## 2023-03-05 NOTE — Addendum Note (Signed)
Addended by: Venia Carbon I on: 03/05/2023 02:19 PM   Modules accepted: Level of Service
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
## 2023-03-08 LAB — PREGNANCY, INITIAL SCREEN
Antibody Screen: NEGATIVE
Basophils Absolute: 0 10*3/uL (ref 0.0–0.2)
Basos: 0 %
Bilirubin, UA: NEGATIVE
Chlamydia trachomatis, NAA: NEGATIVE
EOS (ABSOLUTE): 0 10*3/uL (ref 0.0–0.4)
Eos: 0 %
Glucose, UA: NEGATIVE
HCV Ab: NONREACTIVE
HIV Screen 4th Generation wRfx: NONREACTIVE
Hematocrit: 32.5 % — ABNORMAL LOW (ref 34.0–46.6)
Hemoglobin: 10.9 g/dL — ABNORMAL LOW (ref 11.1–15.9)
Hepatitis B Surface Ag: NEGATIVE
Immature Grans (Abs): 0 10*3/uL (ref 0.0–0.1)
Immature Granulocytes: 0 %
Ketones, UA: NEGATIVE
Lymphocytes Absolute: 0.8 10*3/uL (ref 0.7–3.1)
Lymphs: 14 %
MCH: 29.1 pg (ref 26.6–33.0)
MCHC: 33.5 g/dL (ref 31.5–35.7)
MCV: 87 fL (ref 79–97)
Monocytes Absolute: 0.4 10*3/uL (ref 0.1–0.9)
Monocytes: 6 %
Neisseria Gonorrhoeae by PCR: NEGATIVE
Neutrophils Absolute: 4.8 10*3/uL (ref 1.4–7.0)
Neutrophils: 80 %
Nitrite, UA: NEGATIVE
Platelets: 254 10*3/uL (ref 150–450)
RBC, UA: NEGATIVE
RBC: 3.75 x10E6/uL — ABNORMAL LOW (ref 3.77–5.28)
RDW: 11.7 % (ref 11.7–15.4)
RPR Ser Ql: NONREACTIVE
Rh Factor: POSITIVE
Rubella Antibodies, IGG: 3.3 {index} (ref >0.99)
Specific Gravity, UA: 1.025 (ref 1.005–1.030)
Urobilinogen, Ur: 1 mg/dL (ref 0.2–1.0)
WBC: 5.9 10*3/uL (ref 3.4–10.8)
pH, UA: 6 (ref 5.0–7.5)

## 2023-03-08 LAB — MICROSCOPIC EXAMINATION
Casts: NONE SEEN /[LPF]
Epithelial Cells (non renal): >10 /[HPF] — AB (ref 0–10)

## 2023-03-08 LAB — HEMOGLOBIN A1C
Est. average glucose Bld gHb Est-mCnc: 94 mg/dL
Hgb A1c MFr Bld: 4.9 % (ref 4.8–5.6)

## 2023-03-08 LAB — URINE CULTURE, OB REFLEX

## 2023-03-08 LAB — HCV INTERPRETATION

## 2023-03-12 LAB — GLUCOSE TOLERANCE, 2 HOURS W/ 1HR
Glucose, 1 hour: 97 mg/dL (ref 70–179)
Glucose, 2 hour: 65 mg/dL — ABNORMAL LOW (ref 70–152)
Glucose, Fasting: 72 mg/dL (ref 70–91)

## 2023-03-13 DIAGNOSIS — O0930 Supervision of pregnancy with insufficient antenatal care, unspecified trimester: Secondary | ICD-10-CM | POA: Insufficient documentation

## 2023-03-18 NOTE — Progress Notes (Signed)
   PRENATAL VISIT NOTE  Subjective:  Amanda Simmons is a 28 y.o. G3P2002 at [redacted]w[redacted]d being seen today for ongoing prenatal care.  She is currently monitored for the following issues for this high-risk pregnancy and has Attention deficit hyperactivity disorder (ADHD); Depression; GAD (generalized anxiety disorder); OCD (obsessive compulsive disorder); Atypical squamous cells cannot exclude high grade squamous intraepithelial lesion on cytologic smear of cervix (ASC-H); Supervision of high risk pregnancy, antepartum; Abnormal vaginal Pap smear; Request for sterilization; and Late prenatal care affecting pregnancy, antepartum on their problem list.  Patient reports no complaints.  Contractions: Not present. Vag. Bleeding: None.  Movement: Present. Denies leaking of fluid.   The following portions of the patient's history were reviewed and updated as appropriate: allergies, current medications, past family history, past medical history, past social history, past surgical history and problem list.   Objective:   Vitals:   03/21/23 1459  BP: 109/68  Pulse: 84  Weight: 149 lb (67.6 kg)    Fetal Status: Fetal Heart Rate (bpm): 155   Movement: Present     General:  Alert, oriented and cooperative. Patient is in no acute distress.  Skin: Skin is warm and dry. No rash noted.   Cardiovascular: Normal heart rate noted  Respiratory: Normal respiratory effort, no problems with respiration noted  Abdomen: Soft, gravid, appropriate for gestational age.  Pain/Pressure: Absent      Assessment and Plan:  Pregnancy: G3P2002 at [redacted]w[redacted]d 1. Supervision of high risk pregnancy, antepartum (Primary) 2. [redacted] weeks gestation of pregnancy Flu shot declined  3. Cervical dysplasia ASC-H/HPV neg pap 2022, colpo c/w CIN1 LSIL/HPV+ pap 11/2021 but declined colpo Plans pap at 36 weeks, offered today but she would like to defer as she has her children with her  4. Request for sterilization - She desires permanent  sterilization. Discussed alternatives including LARC options and vasectomy. Reviewed that these alternatives are equally efficacious with lower risk.  She declines these options.  - Discussed surgery of salpingectomy due to lower contraceptive failure and potential ovarian cancer risk reduction - Risks of surgery include but are not limited to: bleeding, infection, injury to surrounding organs/tissues (i.e. bowel/bladder/ureters), need for additional procedures, wound complications, hospital re-admission, and conversion to open surgery, VTE. We reviewed risk of contraceptive failure and risk of regret. Specifically reviewed higher risk of regret under age 88 - Reviewed restrictions and recovery following surgery -After discussion, she would rather do interval surgery. She does not want a PP tubal to prolong her stay in the hospital -Tubal papers previously signed -Address bridge method next visit  5. Current moderate episode of major depressive disorder without prior episode (HCC) 6. GAD (generalized anxiety disorder) 7. Obsessive-compulsive disorder, unspecified type 8. Attention deficit hyperactivity disorder (ADHD), unspecified ADHD type No current medications or therapist Has been on medications briefly in the past but they were not helpful for her Declines behavioral health referral   Please refer to After Visit Summary for other counseling recommendations.   Return in about 2 weeks (around 04/04/2023) for return OB at 35 weeks.  Future Appointments  Date Time Provider Department Center  04/11/2023  3:50 PM Lennart Pall, MD [facility]  04/17/2023  3:50 PM Milas Hock, MD [facility]  04/18/2023  3:30 PM [facility]  04/24/2023  3:50 PM Lennart Pall, MD [facility]  05/01/2023  3:50 PM Milas Hock, MD [facility]   Lennart Pall, MD

## 2023-03-20 ENCOUNTER — Ambulatory Visit: Payer: Medicaid Other | Admitting: Maternal & Fetal Medicine

## 2023-03-20 ENCOUNTER — Other Ambulatory Visit: Payer: Self-pay | Admitting: *Deleted

## 2023-03-20 ENCOUNTER — Other Ambulatory Visit: Payer: Self-pay

## 2023-03-20 ENCOUNTER — Ambulatory Visit: Payer: Medicaid Other | Attending: Obstetrics and Gynecology | Admitting: *Deleted

## 2023-03-20 ENCOUNTER — Other Ambulatory Visit: Payer: Self-pay | Admitting: Obstetrics and Gynecology

## 2023-03-20 ENCOUNTER — Ambulatory Visit (HOSPITAL_BASED_OUTPATIENT_CLINIC_OR_DEPARTMENT_OTHER): Payer: Medicaid Other

## 2023-03-20 VITALS — BP 127/64

## 2023-03-20 DIAGNOSIS — O0930 Supervision of pregnancy with insufficient antenatal care, unspecified trimester: Secondary | ICD-10-CM

## 2023-03-20 DIAGNOSIS — O0933 Supervision of pregnancy with insufficient antenatal care, third trimester: Secondary | ICD-10-CM

## 2023-03-20 DIAGNOSIS — O0990 Supervision of high risk pregnancy, unspecified, unspecified trimester: Secondary | ICD-10-CM

## 2023-03-20 DIAGNOSIS — O368130 Decreased fetal movements, third trimester, not applicable or unspecified: Secondary | ICD-10-CM

## 2023-03-20 DIAGNOSIS — Z363 Encounter for antenatal screening for malformations: Secondary | ICD-10-CM | POA: Diagnosis not present

## 2023-03-20 DIAGNOSIS — Z3A33 33 weeks gestation of pregnancy: Secondary | ICD-10-CM

## 2023-03-20 NOTE — Progress Notes (Signed)
Patient information  Patient Name: Amanda Simmons  Patient MRN:   [id number]  Referring practice: MFM Referring Provider: [facility])  MFM CONSULT  Amanda Simmons is a 28 y.o. G3P2002 at [redacted]w[redacted]d here for ultrasound and consultation. Patient Active Problem List   Diagnosis Date Noted   Late prenatal care affecting pregnancy, antepartum 03/13/2023   Abnormal vaginal Pap smear 03/05/2023   Request for sterilization 03/05/2023   Supervision of high risk pregnancy, antepartum 03/04/2023   Atypical squamous cells cannot exclude high grade squamous intraepithelial lesion on cytologic smear of cervix (ASC-H) 03/14/2020   OCD (obsessive compulsive disorder) 03/24/2018   GAD (generalized anxiety disorder) 03/28/2017   Depression 08/16/2015   Attention deficit hyperactivity disorder (ADHD) 01/17/2015   Amanda Simmons is doing well today with no acute concerns.    RE late prenatal care: The patient was late for prenatal care.  She reports that she try to get in with her OB provider but there is no availability.  She eventually went to the emergency room for a mild trauma and an ultrasound was done given her her current due date.  I discussed that we will have her come back in 1 month to confirm her due date as well as assess the fetal biometry.  Patient also reports that she does not have regular periods and has not had a normal menstrual cycle in about 6 years.  She used to take birth control but is also not taking it consistently in about 6 years.  She thinks that during this pregnancy she may have had less pronounced fetal movement than normal.  I discussed that we performed a biophysical profile and it was normal and since she has not expressed decreased fetal movement on a regular basis and she is comparing her fetal movement to her previous pregnancy there is no need to have antenatal testing.  Sonographic findings Single intrauterine pregnancy at 33w 4d   Fetal cardiac activity:  Observed and appears normal. Presentation: Cephalic. The anatomic structures that were well seen appear normal without evidence of soft markers. Due to poor acoustic windows some structures remain suboptimally visualized. Fetal biometry shows the estimated fetal weight at the 65 percentile.  Amniotic fluid: Within normal limits.  MVP: 6.57 cm. Placenta: Anterior Fundal. Adnexa: No abnormality visualized. BPP was 8/8.  There are limitations of prenatal ultrasound such as the inability to detect certain abnormalities due to poor visualization. Various factors such as fetal position, gestational age and maternal body habitus may increase the difficulty in visualizing the fetal anatomy.    Recommendations -EDD is Estimated Date of Delivery: 05/04/23. -Detailed ultrasound was done today without abnormalities. -F/u growth [place] in 4 weeks.  -Continue routine prenatal care with referring OB provider.  Review of Systems: A review of systems was performed and was negative except per HPI   Vitals and Physical Exam    03/20/2023    1:25 PM 03/05/2023    1:17 PM 11/06/2021    1:53 PM  Vitals with BMI  Height   5\' 6"   Weight  143 lbs 132 lbs  BMI   21.32  Systolic 127 108 409  Diastolic 64 67 75  Pulse  88 85    Sitting comfortably on the sonogram table Nonlabored breathing Normal rate and rhythm Abdomen is nontender  Past pregnancies OB History  Gravida Para Term Preterm AB Living  3 2 2   2   SAB IAB Ectopic Multiple  Live Births     0 2    # Outcome Date GA Lbr Len/2nd Weight Sex Type Anes PTL Lv  3 Current           2 Term 09/22/20 [redacted]w[redacted]d  8 lb (3.629 kg) F Vag-Spont None  LIV  1 Term 04/14/16 [redacted]w[redacted]d 11:44 / 02:00 8 lb 10.8 oz (3.935 kg) F Vag-Spont EPI  LIV    I spent 15 minutes reviewing the patients chart, including labs and images as well as counseling the patient about her medical conditions. Greater than 50% of the time was spent in direct face-to-face  patient counseling.  Braxton Feathers  MFM, [facility]   03/20/2023  2:47 PM

## 2023-03-21 ENCOUNTER — Encounter: Payer: Medicaid Other | Admitting: Obstetrics and Gynecology

## 2023-03-21 ENCOUNTER — Ambulatory Visit (INDEPENDENT_AMBULATORY_CARE_PROVIDER_SITE_OTHER): Payer: Medicaid Other | Admitting: Obstetrics and Gynecology

## 2023-03-21 VITALS — BP 109/68 | HR 84 | Wt 149.0 lb

## 2023-03-21 DIAGNOSIS — O0990 Supervision of high risk pregnancy, unspecified, unspecified trimester: Secondary | ICD-10-CM | POA: Diagnosis not present

## 2023-03-21 DIAGNOSIS — F909 Attention-deficit hyperactivity disorder, unspecified type: Secondary | ICD-10-CM

## 2023-03-21 DIAGNOSIS — F411 Generalized anxiety disorder: Secondary | ICD-10-CM

## 2023-03-21 DIAGNOSIS — F321 Major depressive disorder, single episode, moderate: Secondary | ICD-10-CM

## 2023-03-21 DIAGNOSIS — F429 Obsessive-compulsive disorder, unspecified: Secondary | ICD-10-CM

## 2023-03-21 DIAGNOSIS — Z302 Encounter for sterilization: Secondary | ICD-10-CM | POA: Diagnosis not present

## 2023-03-21 DIAGNOSIS — N870 Mild cervical dysplasia: Secondary | ICD-10-CM | POA: Diagnosis not present

## 2023-03-21 DIAGNOSIS — Z3A33 33 weeks gestation of pregnancy: Secondary | ICD-10-CM | POA: Diagnosis not present

## 2023-04-11 ENCOUNTER — Ambulatory Visit (INDEPENDENT_AMBULATORY_CARE_PROVIDER_SITE_OTHER): Payer: Medicaid Other | Admitting: Obstetrics and Gynecology

## 2023-04-11 ENCOUNTER — Other Ambulatory Visit (HOSPITAL_COMMUNITY)
Admission: RE | Admit: 2023-04-11 | Discharge: 2023-04-11 | Disposition: A | Discharge status: Home / Self Care | Source: Ambulatory Visit | Attending: Obstetrics and Gynecology | Admitting: Obstetrics and Gynecology

## 2023-04-11 VITALS — BP 111/67 | HR 84 | Wt 155.0 lb

## 2023-04-11 DIAGNOSIS — Z3A36 36 weeks gestation of pregnancy: Secondary | ICD-10-CM

## 2023-04-11 DIAGNOSIS — O0993 Supervision of high risk pregnancy, unspecified, third trimester: Secondary | ICD-10-CM

## 2023-04-11 DIAGNOSIS — F429 Obsessive-compulsive disorder, unspecified: Secondary | ICD-10-CM

## 2023-04-11 DIAGNOSIS — Z302 Encounter for sterilization: Secondary | ICD-10-CM | POA: Diagnosis not present

## 2023-04-11 DIAGNOSIS — R87629 Unspecified abnormal cytological findings in specimens from vagina: Secondary | ICD-10-CM

## 2023-04-11 DIAGNOSIS — F909 Attention-deficit hyperactivity disorder, unspecified type: Secondary | ICD-10-CM

## 2023-04-11 DIAGNOSIS — F411 Generalized anxiety disorder: Secondary | ICD-10-CM

## 2023-04-11 DIAGNOSIS — O0990 Supervision of high risk pregnancy, unspecified, unspecified trimester: Secondary | ICD-10-CM | POA: Insufficient documentation

## 2023-04-11 DIAGNOSIS — F321 Major depressive disorder, single episode, moderate: Secondary | ICD-10-CM

## 2023-04-12 LAB — CERVICOVAGINAL ANCILLARY ONLY
Chlamydia: NEGATIVE
Comment: NEGATIVE
Comment: NORMAL
Neisseria Gonorrhea: NEGATIVE

## 2023-04-14 NOTE — Progress Notes (Signed)
   PRENATAL VISIT NOTE  Subjective:  Amanda Simmons is a 28 y.o. G3P2002 at [redacted]w[redacted]d being seen today for ongoing prenatal care.  She is currently monitored for the following issues for this low-risk pregnancy and has Attention deficit hyperactivity disorder (ADHD); Depression; GAD (generalized anxiety disorder); OCD (obsessive compulsive disorder); Atypical squamous cells cannot exclude high grade squamous intraepithelial lesion on cytologic smear of cervix (ASC-H); Supervision of high risk pregnancy, antepartum; Abnormal vaginal Pap smear; Request for sterilization; and Late prenatal care affecting pregnancy, antepartum on their problem list.  Patient reports  doing ok overall .  Contractions: Not present. Vag. Bleeding: None.  Movement: Present. Denies leaking of fluid.   The following portions of the patient's history were reviewed and updated as appropriate: allergies, current medications, past family history, past medical history, past social history, past surgical history and problem list.   Objective:   Vitals:   04/11/23 1545  BP: 111/67  Pulse: 84  Weight: 155 lb (70.3 kg)    Fetal Status:     Movement: Present     General:  Alert, oriented and cooperative. Patient is in no acute distress.  Skin: Skin is warm and dry. No rash noted.   Cardiovascular: Normal heart rate noted  Respiratory: Normal respiratory effort, no problems with respiration noted  Abdomen: Soft, gravid, appropriate for gestational age.  Pain/Pressure: Absent      Assessment and Plan:  Pregnancy: G3P2002 at [redacted]w[redacted]d 1. Supervision of high risk pregnancy, antepartum (Primary) 2. [redacted] weeks gestation of pregnancy Growth [place] scheduled 3/13 - Culture, beta strep (group b only) - Cervicovaginal ancillary only( [place])  3. Request for sterilization Discussed timing of laparoscopic BS after delivery; she is considering if she wants to move forward with surgery. She is also interested in hysterectomy due to  history of abnormal paps - discussed that I wouldn't recommend hysterectomy without appropriate work up prior  Declines bridge method She is talking to her partner about him potentially getting vasectomy - reviewed this is lower surgical risk and shorter recovery  4. Abnormal vaginal Pap smear Declines pap today Emphasized importance of PP pap  5. Obsessive-compulsive disorder, unspecified type 6. GAD (generalized anxiety disorder) 7. Current moderate episode of major depressive disorder without prior episode (HCC) 8. Attention deficit hyperactivity disorder (ADHD), unspecified ADHD type No current medications  Preterm labor symptoms and general obstetric precautions including but not limited to vaginal bleeding, contractions, leaking of fluid and fetal movement were reviewed in detail with the patient. Please refer to After Visit Summary for other counseling recommendations.   Future Appointments  Date Time Provider Department Center  04/17/2023  3:50 PM Milas Hock, MD [facility]  04/18/2023  3:30 PM [facility]  04/24/2023  3:50 PM Lennart Pall, MD [facility]  05/01/2023  3:50 PM Milas Hock, MD [facility]    Lennart Pall, MD

## 2023-04-15 ENCOUNTER — Encounter: Payer: Self-pay | Admitting: Obstetrics and Gynecology

## 2023-04-15 LAB — CULTURE, BETA STREP (GROUP B ONLY): Strep Gp B Culture: NEGATIVE

## 2023-04-17 ENCOUNTER — Encounter: Payer: Self-pay | Admitting: Obstetrics and Gynecology

## 2023-04-17 ENCOUNTER — Telehealth (INDEPENDENT_AMBULATORY_CARE_PROVIDER_SITE_OTHER): Payer: Medicaid Other | Admitting: Obstetrics and Gynecology

## 2023-04-17 DIAGNOSIS — R87629 Unspecified abnormal cytological findings in specimens from vagina: Secondary | ICD-10-CM

## 2023-04-17 DIAGNOSIS — O0990 Supervision of high risk pregnancy, unspecified, unspecified trimester: Secondary | ICD-10-CM | POA: Diagnosis not present

## 2023-04-17 DIAGNOSIS — F411 Generalized anxiety disorder: Secondary | ICD-10-CM

## 2023-04-17 DIAGNOSIS — O0930 Supervision of pregnancy with insufficient antenatal care, unspecified trimester: Secondary | ICD-10-CM

## 2023-04-17 NOTE — Progress Notes (Signed)
 OBSTETRICS PRENATAL VIRTUAL VISIT ENCOUNTER NOTE  Provider location: [facility] [place]   Patient location: Home  I connected with Amanda Simmons on 04/17/23 at  3:50 PM EDT by MyChart Video Encounter and verified that I am speaking with the correct person using two identifiers. I discussed the limitations, risks, security and privacy concerns of performing an evaluation and management service virtually and the availability of in person appointments. I also discussed with the patient that there may be a patient responsible charge related to this service. The patient expressed understanding and agreed to proceed. Subjective:  Amanda Simmons is a 28 y.o. G3P2002 at [redacted]w[redacted]d being seen today for ongoing prenatal care.  She is currently monitored for the following issues for this low-risk pregnancy and has Attention deficit hyperactivity disorder (ADHD); Depression; GAD (generalized anxiety disorder); OCD (obsessive compulsive disorder); Atypical squamous cells cannot exclude high grade squamous intraepithelial lesion on cytologic smear of cervix (ASC-H); Supervision of high risk pregnancy, antepartum; Abnormal vaginal Pap smear; Request for sterilization; and Late prenatal care affecting pregnancy, antepartum on their problem list.  Patient reports no complaints.   .  .   . Denies any leaking of fluid.   The following portions of the patient's history were reviewed and updated as appropriate: allergies, current medications, past family history, past medical history, past social history, past surgical history and problem list.   Objective:  There were no vitals filed for this visit.  Fetal Status:           General:  Alert, oriented and cooperative. Patient is in no acute distress.  Respiratory: Normal respiratory effort, no problems with respiration noted  Mental Status: Normal mood and affect. Normal behavior. Normal judgment and thought content.  Rest of  physical exam deferred due to type of encounter  Imaging: [place] MFM OB DETAIL +14 WK Result Date: 03/20/2023 ----------------------------------------------------------------------  OBSTETRICS REPORT                       (Signed Final 03/20/2023 02:51 pm) ---------------------------------------------------------------------- Patient Info  ID #:       [id number]                          D.O.B.:  06/19/1995 (27 yrs)(F)  Name:       Amanda Simmons              Visit Date: 03/20/2023 01:08 pm ---------------------------------------------------------------------- Performed By  Attending:        Braxton Feathers DO       Ref. Address:     [address], [place]  Performed By:     Isac Sarna        [facility]  Referred By:      Everardo All ---------------------------------------------------------------------- Orders  #  Description                           Code        Ordered By  1  [place] MFM OB DETAIL +14 WK               76811.01    JENNIFER RASCH  2  [place] MFM FETAL BPP WO NON               76819.01    JENNIFER [facility]     STRESS ----------------------------------------------------------------------  #  Order #                     Accession #                Episode #  1  [id number]                   [id number]                 [id number]  2  [id number]                   [id number]                 [id number] ---------------------------------------------------------------------- Indications  Decreased fetal movement                       O36.8190  Late to prenatal care, third trimester         O09.33  Insufficient Prenatal Care                     O09.30  [redacted] weeks gestation of pregnancy                Z3A.33  Encounter  for antenatal screening for          Z36.3  malformations  Declined genetic screening  2hr GTT wnl ---------------------------------------------------------------------- Fetal Evaluation  Num Of Fetuses:         1  Fetal Heart Rate(bpm):  139  Cardiac Activity:       Observed  Presentation:           Cephalic  Placenta:               Anterior Fundal  P. Cord Insertion:      Visualized, central  Amniotic Fluid  AFI FV:      Within normal limits  AFI Sum(cm)     %Tile       Largest Pocket(cm)  20.73  78          6.57  RUQ(cm)       RLQ(cm)       LUQ(cm)        LLQ(cm)  5.88          6.57          3.87           4.41 ---------------------------------------------------------------------- Biophysical Evaluation  Amniotic F.V:   Pocket => 2 cm             F. Tone:        Observed  F. Movement:    Observed                   Score:          8/8  F. Breathing:   Observed ---------------------------------------------------------------------- Biometry  BPD:      88.1  mm     G. Age:  35w 4d         93  %    CI:        79.64   %    70 - 86                                                          FL/HC:      20.1   %    19.4 - 21.8  HC:       312   mm     G. Age:  34w 6d         48  %    HC/AC:      1.01        0.96 - 1.11  AC:      309.2  mm     G. Age:  34w 6d         85  %    FL/BPD:     71.2   %    71 - 87  FL:       62.7  mm     G. Age:  32w 3d         14  %    FL/AC:      20.3   %    20 - 24  HUM:      55.9  mm     G. Age:  32w 4d         36  %  CER:      44.1  mm     G. Age:  34w 3d         44  %  CM:        6.5  mm  Est. FW:    2394  gm      5 lb 4 oz     65  % ---------------------------------------------------------------------- OB History  Blood Type:   O+  Gravidity:    3         Term:   2  Living:       2 ---------------------------------------------------------------------- Gestational Age  U/S Today:     34w 3d  EDD:   04/28/23  Best:          33w 4d     Det. ByMarcella Dubs         EDD:   05/04/23                                      (02/28/23) ---------------------------------------------------------------------- Targeted Anatomy  Central Nervous System  Calvarium/Cranial V.:  Appears normal         Cereb./Vermis:          Appears normal  Cavum:                 Appears normal         Cisterna Magna:         Appears normal  Lateral Ventricles:    Appears normal         Midline Falx:           Appears normal  Choroid Plexus:        Appears normal  Spine  Cervical:              Limited                Sacral:                 Appears normal  Thoracic:              Appears normal         Shape/Curvature:        Appears normal  Lumbar:                Appears normal  Head/Neck  Lips:                  Appears normal         Profile:                Appears normal  Neck:                  Appears normal         Orbits/Eyes:            Appears normal  Nuchal Fold:           Not applicable         Mandible:               Appears normal  Nasal Bone:            Present                Maxilla:                Appears normal  Thorax  4 Chamber View:        Appears normal         Interventr. Septum:     Appears normal  Cardiac Rhythm:        Normal                 Cardiac Axis:           Normal  Cardiac Situs:         Appears normal         Diaphragm:              Appears normal  Rt Outflow Tract:      Appears normal  3 Vessel View:          Appears normal  Lt Outflow Tract:      Appears normal         3 V Trachea View:       Appears normal  Aortic Arch:           Appears normal         IVC:                    Appears normal  Ductal Arch:           Not well visualized    Crossing:               Appears normal  SVC:                   Appears normal  Abdomen  Ventral Wall:          Appears normal         Lt Kidney:              Appears normal  Cord Insertion:        Appears normal         Rt Kidney:              Appears normal  Situs:                 Appears normal         Bladder:                 Appears normal  Stomach:               Appears normal  Extremities  Lt Humerus:            Appears normal         Lt Femur:               Appears normal  Rt Humerus:            Appears normal         Rt Femur:               Appears normal  Lt Forearm:            Appears normal         Lt Lower Leg:           Appears normal  Rt Forearm:            Appears normal         Rt Lower Leg:           Appears normal  Lt Hand:               Open hand nml          Lt Foot:                Foot visualized  Rt Hand:               Open hand nml          Rt Foot:                Foot visualized  Other  Umbilical Cord:        Normal 3-vessel        Genitalia:              Female-nml  Comment:     Technically difficult due to gestational age and  fetal position. ---------------------------------------------------------------------- Cervix Uterus Adnexa  Cervix  Not visualized (advanced GA >24wks)  Uterus  No abnormality visualized.  Right Ovary  Size(cm)     1.77   x   2.59   x  1.69      Vol(ml): 4.06  Within normal limits.  Left Ovary  Size(cm)     1.29   x   0.94   x  0.96      Vol(ml): 0.61  Within normal limits.  Cul De Sac  No free fluid seen.  Adnexa  No abnormality visualized ---------------------------------------------------------------------- Comments  MFM Consult Note  Gertha N Ahles is doing well today with no acute  concerns.  RE late prenatal care: The patient was late for prenatal care.  She reports that she try to get in with her OB provider but  there is no availability.  She eventually went to the emergency  room for a mild trauma and an ultrasound was done given her  her current due date.  I discussed that we will have her come  back in 1 month to confirm her due date as well as assess  the fetal biometry.  Patient also reports that she does not  have regular periods and has not had a normal menstrual  cycle in about 6 years.  She used to take birth control but is  also not taking it consistently in  about 6 years.  She thinks  that during this pregnancy she may have had less  pronounced fetal movement than normal.  I discussed that we  performed a biophysical profile and it was normal and since  she has not expressed decreased fetal movement on a  regular basis and she is comparing her fetal movement to her  previous pregnancy there is no need to have antenatal  testing.  Sonographic findings  Single intrauterine pregnancy at 33w 4d  Fetal cardiac activity:  Observed and appears normal.  Presentation: Cephalic.  The anatomic structures that were well seen appear normal  without evidence of soft markers. Due to poor acoustic  windows some structures remain suboptimally visualized.  Fetal biometry shows the estimated fetal weight at the 65  percentile.  Amniotic fluid: Within normal limits.  MVP: 6.57 cm.  Placenta: Anterior Fundal.  Adnexa: No abnormality visualized.  BPP was 8/8.  There are limitations of prenatal ultrasound such as the  inability to detect certain abnormalities due to poor  visualization. Various factors such as fetal position,  gestational age and maternal body habitus may increase the  difficulty in visualizing the fetal anatomy.  Recommendations  -EDD is Estimated Date of Delivery: 05/04/23.  -Detailed ultrasound was done today without abnormalities.  -F/u growth [place] in 4 weeks.  -Continue routine prenatal care with referring OB provider. ----------------------------------------------------------------------                  Braxton Feathers, DO Electronically Signed Final Report   03/20/2023 02:51 pm ----------------------------------------------------------------------   [place] MFM FETAL BPP WO NON STRESS Result Date: 03/20/2023 ----------------------------------------------------------------------  OBSTETRICS REPORT                       (Signed Final 03/20/2023 02:51 pm) ---------------------------------------------------------------------- Patient Info  ID #:       [id number]                           D.O.B.:  07/18/1995 (27 yrs)(F)  Name:  Amanda Simmons              Visit Date: 03/20/2023 01:08 pm ---------------------------------------------------------------------- Performed By  Attending:        Braxton Feathers DO       Ref. Address:     [address] [place]  Performed By:     Isac Sarna        [facility]                                  [facility]  Referred By:      [facility] ---------------------------------------------------------------------- Orders  #  Description                           Code        Ordered By  1  [place] MFM OB DETAIL +14 WK               76811.01    JENNIFER RASCH  2  [place] MFM FETAL BPP WO NON               76819.01    JENNIFER [facility]     STRESS ----------------------------------------------------------------------  #  Order #                     Accession #                Episode #  1  [id number]                   [id number]                 [id number]  2  [id number]                   [id number]                 [id number] ---------------------------------------------------------------------- Indications  Decreased fetal movement                       O36.8190  Late to prenatal care, third trimester  O09.33  Insufficient Prenatal Care                     O09.30  [redacted] weeks gestation of pregnancy                Z3A.33  Encounter for antenatal screening for          Z36.3  malformations  Declined genetic screening  2hr GTT wnl ---------------------------------------------------------------------- Fetal Evaluation  Num Of Fetuses:         1  Fetal Heart Rate(bpm):  139  Cardiac Activity:       Observed  Presentation:           Cephalic  Placenta:               Anterior Fundal  P. Cord Insertion:       Visualized, central  Amniotic Fluid  AFI FV:      Within normal limits  AFI Sum(cm)     %Tile       Largest Pocket(cm)  20.73           78          6.57  RUQ(cm)       RLQ(cm)       LUQ(cm)        LLQ(cm)  5.88          6.57          3.87           4.41 ---------------------------------------------------------------------- Biophysical Evaluation  Amniotic F.V:   Pocket => 2 cm             F. Tone:        Observed  F. Movement:    Observed                   Score:          8/8  F. Breathing:   Observed ---------------------------------------------------------------------- Biometry  BPD:      88.1  mm     G. Age:  35w 4d         93  %    CI:        79.64   %    70 - 86                                                          FL/HC:      20.1   %    19.4 - 21.8  HC:       312   mm     G. Age:  34w 6d         48  %    HC/AC:      1.01        0.96 - 1.11  AC:      309.2  mm     G. Age:  34w 6d         85  %    FL/BPD:     71.2   %    71 - 87  FL:       62.7  mm     G. Age:  32w 3d         14  %    FL/AC:  20.3   %    20 - 24  HUM:      55.9  mm     G. Age:  32w 4d         36  %  CER:      44.1  mm     G. Age:  34w 3d         44  %  CM:        6.5  mm  Est. FW:    2394  gm      5 lb 4 oz     65  % ---------------------------------------------------------------------- OB History  Blood Type:   O+  Gravidity:    3         Term:   2  Living:       2 ---------------------------------------------------------------------- Gestational Age  U/S Today:     34w 3d                                        EDD:   04/28/23  Best:          33w 4d     Det. ByMarcella Dubs         EDD:   05/04/23                                      (02/28/23) ---------------------------------------------------------------------- Targeted Anatomy  Central Nervous System  Calvarium/Cranial V.:  Appears normal         Cereb./Vermis:          Appears normal  Cavum:                 Appears normal         Cisterna Magna:         Appears normal  Lateral  Ventricles:    Appears normal         Midline Falx:           Appears normal  Choroid Plexus:        Appears normal  Spine  Cervical:              Limited                Sacral:                 Appears normal  Thoracic:              Appears normal         Shape/Curvature:        Appears normal  Lumbar:                Appears normal  Head/Neck  Lips:                  Appears normal         Profile:                Appears normal  Neck:                  Appears normal         Orbits/Eyes:            Appears normal  Nuchal Fold:           Not applicable  Mandible:               Appears normal  Nasal Bone:            Present                Maxilla:                Appears normal  Thorax  4 Chamber View:        Appears normal         Interventr. Septum:     Appears normal  Cardiac Rhythm:        Normal                 Cardiac Axis:           Normal  Cardiac Situs:         Appears normal         Diaphragm:              Appears normal  Rt Outflow Tract:      Appears normal         3 Vessel View:          Appears normal  Lt Outflow Tract:      Appears normal         3 V Trachea View:       Appears normal  Aortic Arch:           Appears normal         IVC:                    Appears normal  Ductal Arch:           Not well visualized    Crossing:               Appears normal  SVC:                   Appears normal  Abdomen  Ventral Wall:          Appears normal         Lt Kidney:              Appears normal  Cord Insertion:        Appears normal         Rt Kidney:              Appears normal  Situs:                 Appears normal         Bladder:                Appears normal  Stomach:               Appears normal  Extremities  Lt Humerus:            Appears normal         Lt Femur:               Appears normal  Rt Humerus:            Appears normal         Rt Femur:               Appears normal  Lt Forearm:            Appears normal         Lt Lower Leg:  Appears normal  Rt Forearm:            Appears normal          Rt Lower Leg:           Appears normal  Lt Hand:               Open hand nml          Lt Foot:                Foot visualized  Rt Hand:               Open hand nml          Rt Foot:                Foot visualized  Other  Umbilical Cord:        Normal 3-vessel        Genitalia:              Female-nml  Comment:     Technically difficult due to gestational age and fetal position. ---------------------------------------------------------------------- Cervix Uterus Adnexa  Cervix  Not visualized (advanced GA >24wks)  Uterus  No abnormality visualized.  Right Ovary  Size(cm)     1.77   x   2.59   x  1.69      Vol(ml): 4.06  Within normal limits.  Left Ovary  Size(cm)     1.29   x   0.94   x  0.96      Vol(ml): 0.61  Within normal limits.  Cul De Sac  No free fluid seen.  Adnexa  No abnormality visualized ---------------------------------------------------------------------- Comments  MFM Consult Note  Emil N Lennox is doing well today with no acute  concerns.  RE late prenatal care: The patient was late for prenatal care.  She reports that she try to get in with her OB provider but  there is no availability.  She eventually went to the emergency  room for a mild trauma and an ultrasound was done given her  her current due date.  I discussed that we will have her come  back in 1 month to confirm her due date as well as assess  the fetal biometry.  Patient also reports that she does not  have regular periods and has not had a normal menstrual  cycle in about 6 years.  She used to take birth control but is  also not taking it consistently in about 6 years.  She thinks  that during this pregnancy she may have had less  pronounced fetal movement than normal.  I discussed that we  performed a biophysical profile and it was normal and since  she has not expressed decreased fetal movement on a  regular basis and she is comparing her fetal movement to her  previous pregnancy there is no need to have antenatal  testing.   Sonographic findings  Single intrauterine pregnancy at 33w 4d  Fetal cardiac activity:  Observed and appears normal.  Presentation: Cephalic.  The anatomic structures that were well seen appear normal  without evidence of soft markers. Due to poor acoustic  windows some structures remain suboptimally visualized.  Fetal biometry shows the estimated fetal weight at the 65  percentile.  Amniotic fluid: Within normal limits.  MVP: 6.57 cm.  Placenta: Anterior Fundal.  Adnexa: No abnormality visualized.  BPP was 8/8.  There are limitations of prenatal ultrasound such as the  inability to  detect certain abnormalities due to poor  visualization. Various factors such as fetal position,  gestational age and maternal body habitus may increase the  difficulty in visualizing the fetal anatomy.  Recommendations  -EDD is Estimated Date of Delivery: 05/04/23.  -Detailed ultrasound was done today without abnormalities.  -F/u growth [place] in 4 weeks.  -Continue routine prenatal care with referring OB provider. ----------------------------------------------------------------------                  Braxton Feathers, DO Electronically Signed Final Report   03/20/2023 02:51 pm ----------------------------------------------------------------------    Assessment and Plan:  Pregnancy: [id number] at [redacted]w[redacted]d 1. Supervision of high risk pregnancy, antepartum (Primary) GBS neg Discussed tubal - desires salpingectomy. Discussed PPTL vs interval BTS. She would like interval BTS. Will set up PP visit with me per her request.   2. GAD (generalized anxiety disorder)  3. Abnormal vaginal Pap smear Pap PP  4. Late prenatal care affecting pregnancy, antepartum Presented at 31 weeks.   Preterm labor symptoms and general obstetric precautions including but not limited to vaginal bleeding, contractions, leaking of fluid and fetal movement were reviewed in detail with the patient. I discussed the assessment and treatment plan with the patient. The  patient was provided an opportunity to ask questions and all were answered. The patient agreed with the plan and demonstrated an understanding of the instructions. The patient was advised to call back or seek an in-person office evaluation/go to MAU at [facility] for any urgent or concerning symptoms. Please refer to After Visit Summary for other counseling recommendations.   I provided 7 minutes of face-to-face time during this encounter.  Return in about 1 week (around 04/24/2023) for Virtual OB Visit.  Future Appointments  Date Time Provider Department Center  04/18/2023  3:30 PM [facility] US3 [facility]  04/24/2023  3:50 PM Lennart Pall, MD [facility]  05/01/2023  3:50 PM Milas Hock, MD [facility]    Milas Hock, MD Center for [facility], [facility]

## 2023-04-18 ENCOUNTER — Ambulatory Visit: Payer: Medicaid Other | Attending: Maternal & Fetal Medicine

## 2023-04-18 ENCOUNTER — Ambulatory Visit: Attending: Maternal & Fetal Medicine | Admitting: Maternal & Fetal Medicine

## 2023-04-18 ENCOUNTER — Other Ambulatory Visit: Payer: Self-pay | Admitting: Maternal & Fetal Medicine

## 2023-04-18 DIAGNOSIS — O403XX0 Polyhydramnios, third trimester, not applicable or unspecified: Secondary | ICD-10-CM

## 2023-04-18 DIAGNOSIS — O0933 Supervision of pregnancy with insufficient antenatal care, third trimester: Secondary | ICD-10-CM | POA: Diagnosis not present

## 2023-04-18 DIAGNOSIS — Z3A37 37 weeks gestation of pregnancy: Secondary | ICD-10-CM

## 2023-04-19 ENCOUNTER — Other Ambulatory Visit: Payer: Self-pay | Admitting: *Deleted

## 2023-04-19 DIAGNOSIS — O403XX0 Polyhydramnios, third trimester, not applicable or unspecified: Secondary | ICD-10-CM

## 2023-04-19 DIAGNOSIS — O0930 Supervision of pregnancy with insufficient antenatal care, unspecified trimester: Secondary | ICD-10-CM

## 2023-04-19 NOTE — Progress Notes (Signed)
 Patient information  Patient Name: Amanda Simmons  Patient MRN:   [id number]  Referring practice: MFM Referring Provider: [facility])  MFM CONSULT  Amanda Simmons is a 28 y.o. G3P2002 at 101w6d here for ultrasound and consultation. Patient Active Problem List   Diagnosis Date Noted   Polyhydramnios affecting pregnancy in third trimester 04/18/2023   Late prenatal care affecting pregnancy, antepartum 03/13/2023   Abnormal vaginal Pap smear 03/05/2023   Request for sterilization 03/05/2023   Supervision of high risk pregnancy, antepartum 03/04/2023   Atypical squamous cells cannot exclude high grade squamous intraepithelial lesion on cytologic smear of cervix (ASC-H) 03/14/2020   OCD (obsessive compulsive disorder) 03/24/2018   GAD (generalized anxiety disorder) 03/28/2017   Depression 08/16/2015   Attention deficit hyperactivity disorder (ADHD) 01/17/2015    Amanda Simmons has a pregnancy with the complications mentioned in the problem list. During today's visit we focused on the following concerns:   Polyhydramnios was seen on today's ultrasound. There cardiac anatomy appears normal. There is no evidence of chorioangioma. The fetal movement is normal without evidence of arthrogryposis. The fetal stomach and bladder appears normal. The fetal palate (although limited in views) and neck appears to be normal without evidence of mass. There is no evidence of of lower spine or pelvic abnormalities.   I discussed the diagnosis, management, prognosis and clinical implications of polyhydramnios and the clinical implications during pregnancy. Amniotic fluid is made from the fetal kidneys and at the normal fluid ranges from 5 to 25 cm on amniotic fluid index.  Potential causes of elevated amniotic fluid in pregnancy. I discussed that most the time the cause is unknown but the most common cause that is identifiable is maternal diabetes.  Also discussed that  there is a potential for birth defects or neurologic conditions that can result in elevated amniotic fluid. Potential complications associated with polyhydramnios include malpresentation, increased risk of cord prolapse, increased discomfort during the end of pregnancy, increased risk of preterm contractions and increased risk of stillbirth.  In particular the risk of fetal anomalies and neonatal abnormalities increases with the degree of polyhydramnios.  Approximately 1% of newborns and 5 to 10% of fetuses with mild polyhydramnios will have an abnormality (tracheoesophageal fistula are among the most common abnormalities associated with polyhydramnios).  This increases to 2-5 times a risk with moderate and severe polyhydramnios.  Currently there is no treatment of polyhydramnios if it is idiopathic or due to fetal abnormalities.  If the elevation in fluid is related to maternal diabetes, better glucose control should reduce the degree of polyhydramnios from osmotic diuresis.  Sonographic findings Single intrauterine pregnancy. Fetal cardiac activity: Observed. Presentation: Cephalic. Interval fetal anatomy appears normal. Fetal biometry shows the estimated fetal weight at the 94 percentile. Amniotic fluid: Polyhydramnios. MVP: 9.9 cm. Placenta: Anterior Fundal. BPP: 8/8.   Recommendations -Continue weekly testing -Delivery around 39 weeks due to poly and LGA. A 38 week delivery may be indicated due to extreme maternal discomfort.   Review of Systems: A review of systems was performed and was negative except per HPI   Vitals and Physical Exam    04/11/2023    3:45 PM 03/21/2023    2:59 PM 03/20/2023    1:25 PM  Vitals with BMI  Weight 155 lbs 149 lbs   Systolic 111 109 409  Diastolic 67 68 64  Pulse 84 84   Sitting comfortably on the sonogram table Nonlabored breathing Normal rate  and rhythm Abdomen is nontender  Past pregnancies OB History  Gravida Para Term Preterm AB Living  3 2 2    2   SAB IAB Ectopic Multiple Live Births     0 2    # Outcome Date GA Lbr Len/2nd Weight Sex Type Anes PTL Lv  3 Current           2 Term 09/22/20 [redacted]w[redacted]d  8 lb (3.629 kg) F Vag-Spont None  LIV  1 Term 04/14/16 [redacted]w[redacted]d 11:44 / 02:00 8 lb 10.8 oz (3.935 kg) F Vag-Spont EPI  LIV     I spent 20 minutes reviewing the patients chart, including labs and images as well as counseling the patient about her medical conditions. Greater than 50% of the time was spent in direct face-to-face patient counseling.  Braxton Feathers, DO Maternal fetal medicine, [place]   04/19/2023  11:56 AM

## 2023-04-24 ENCOUNTER — Telehealth (INDEPENDENT_AMBULATORY_CARE_PROVIDER_SITE_OTHER): Payer: Medicaid Other | Admitting: Obstetrics and Gynecology

## 2023-04-24 DIAGNOSIS — F321 Major depressive disorder, single episode, moderate: Secondary | ICD-10-CM

## 2023-04-24 DIAGNOSIS — O403XX0 Polyhydramnios, third trimester, not applicable or unspecified: Secondary | ICD-10-CM | POA: Diagnosis not present

## 2023-04-24 DIAGNOSIS — Z302 Encounter for sterilization: Secondary | ICD-10-CM | POA: Diagnosis not present

## 2023-04-24 DIAGNOSIS — F429 Obsessive-compulsive disorder, unspecified: Secondary | ICD-10-CM

## 2023-04-24 DIAGNOSIS — O3663X0 Maternal care for excessive fetal growth, third trimester, not applicable or unspecified: Secondary | ICD-10-CM

## 2023-04-24 DIAGNOSIS — F909 Attention-deficit hyperactivity disorder, unspecified type: Secondary | ICD-10-CM

## 2023-04-24 DIAGNOSIS — O0990 Supervision of high risk pregnancy, unspecified, unspecified trimester: Secondary | ICD-10-CM | POA: Diagnosis not present

## 2023-04-24 DIAGNOSIS — Z3A38 38 weeks gestation of pregnancy: Secondary | ICD-10-CM

## 2023-04-24 DIAGNOSIS — R87629 Unspecified abnormal cytological findings in specimens from vagina: Secondary | ICD-10-CM

## 2023-04-24 DIAGNOSIS — F411 Generalized anxiety disorder: Secondary | ICD-10-CM

## 2023-04-24 NOTE — Progress Notes (Signed)
 TELEHEALTH OBSTETRICS VISIT ENCOUNTER NOTE  Provider location: [facility] [employer] [facility] [place]   Patient location: Car (pulled over & parked at daughter's school)   I connected with [name] on 04/24/23 at  3:50 PM EDT by MyChart audiovisual encounter  Subjective:  Amanda Simmons is a 28 y.o. G3P2002 at [redacted]w[redacted]d being followed for ongoing prenatal care.  She is currently monitored for the following issues for this high-risk pregnancy and has Attention deficit hyperactivity disorder (ADHD); Depression; GAD (generalized anxiety disorder); OCD (obsessive compulsive disorder); Atypical squamous cells cannot exclude high grade squamous intraepithelial lesion on cytologic smear of cervix (ASC-H); Supervision of high risk pregnancy, antepartum; Abnormal vaginal Pap smear; Request for sterilization; Late prenatal care affecting pregnancy, antepartum; and Polyhydramnios affecting pregnancy in third trimester on their problem list.  Patient reports no complaints. Getting more uncomfortable with pregnancy but doing well. Reports fetal movement. Denies any contractions, bleeding or leaking of fluid.   The following portions of the patient's history were reviewed and updated as appropriate: allergies, current medications, past family history, past medical history, past social history, past surgical history and problem list.   Objective:  currently breastfeeding. Doesn't have blood pressure cuff General:  Alert, oriented and cooperative.   Mental Status: Normal mood and affect perceived. Normal judgment and thought content.  Rest of physical exam deferred due to type of encounter  Assessment and Plan:  Pregnancy: G3P2002 at [redacted]w[redacted]d 1. Supervision of high risk pregnancy, antepartum (Primary) 2. [redacted] weeks gestation of pregnancy Discussed IOL at 39 weeks for mild polyhydramnios & LGA. She currently declines and would prefer to wait. Discussed membrane sweep next appt and  monitoring AFI with weekly BPP as planned. If poly worsens or new concerns arise, would more strongly recommend IOL in 39th week. If fluid normalizes could consider pdIOL.   3. Polyhydramnios affecting pregnancy in third trimester 10. LGA fetus Dx 3/13 - @[redacted]w[redacted]d  3791g (94%), AC > 99%, AFI 33.87, cephalic, ant fundal placenta Normal 2h GTT Pelvis proven to 3935g  Weekly antenatal testing See above  4. Request for sterilization Planning interval tubal, declines bridge method Plans to schedule PP visit w/ Para March so tubal can be scheduled shortly afterwards  5. Abnormal vaginal Pap smear PP pap  6. Current moderate episode of major depressive disorder without prior episode (HCC) 7. GAD (generalized anxiety disorder) 8. Obsessive-compulsive disorder, unspecified type 9. Attention deficit hyperactivity disorder (ADHD), unspecified ADHD type No complaints today  Term labor symptoms and general obstetric precautions including but not limited to vaginal bleeding, contractions, leaking of fluid and fetal movement were reviewed in detail with the patient.  I discussed the assessment and treatment plan with the patient. The patient was provided an opportunity to ask questions and all were answered. The patient agreed with the plan and demonstrated an understanding of the instructions. The patient was advised to call back or seek an in-person office evaluation/go to [facility] at [facility] for any urgent or concerning symptoms. Please refer to After Visit Summary for other counseling recommendations.   I provided 10 minutes of non-face-to-face time during this encounter.  Future Appointments  Date Time Provider Department Center  04/24/2023  3:50 PM Lennart Pall, MD [facility]  04/26/2023  9:30 AM [facility] US2 [facility]  05/01/2023  3:50 PM Milas Hock, MD [facility]  05/03/2023  9:30 AM [facility] US4 [facility]    Lennart Pall, MD [facility]  [employer], [facility]

## 2023-04-25 ENCOUNTER — Telehealth: Payer: Self-pay

## 2023-04-25 NOTE — Telephone Encounter (Signed)
 appt

## 2023-04-26 ENCOUNTER — Ambulatory Visit (HOSPITAL_BASED_OUTPATIENT_CLINIC_OR_DEPARTMENT_OTHER): Admitting: Maternal & Fetal Medicine

## 2023-04-26 ENCOUNTER — Ambulatory Visit: Attending: Maternal & Fetal Medicine

## 2023-04-26 DIAGNOSIS — O0930 Supervision of pregnancy with insufficient antenatal care, unspecified trimester: Secondary | ICD-10-CM | POA: Diagnosis present

## 2023-04-26 DIAGNOSIS — O403XX0 Polyhydramnios, third trimester, not applicable or unspecified: Secondary | ICD-10-CM | POA: Diagnosis present

## 2023-04-26 DIAGNOSIS — Z3A38 38 weeks gestation of pregnancy: Secondary | ICD-10-CM

## 2023-04-26 NOTE — Progress Notes (Signed)
   Patient information  Patient Name: Amanda Simmons  Patient MRN:   [id number]  Referring practice: MFM Referring Provider: [place] [name]  MFM CONSULT  Amanda Simmons is a 28 y.o. G3P2002 at [redacted]w[redacted]d here for ultrasound and consultation. Patient Active Problem List   Diagnosis Date Noted   Polyhydramnios affecting pregnancy in third trimester 04/18/2023   Late prenatal care affecting pregnancy, antepartum 03/13/2023   Abnormal vaginal Pap smear 03/05/2023   Request for sterilization 03/05/2023   Supervision of high risk pregnancy, antepartum 03/04/2023   Atypical squamous cells cannot exclude high grade squamous intraepithelial lesion on cytologic smear of cervix (ASC-H) 03/14/2020   OCD (obsessive compulsive disorder) 03/24/2018   GAD (generalized anxiety disorder) 03/28/2017   Depression 08/16/2015   Attention deficit hyperactivity disorder (ADHD) 01/17/2015   Amanda Simmons has a pregnancy with the complications mentioned in the problem list. During today's visit we focused on the following concerns:   Amanda Simmons expressed concerns about her delivery timing and she would like to go closer to her due date if possible.  I discussed that her only indication of polyhydramnios does not require delivery right at 39 weeks and as long as the fetal testing remains reassuring she can go to her due date.  I discussed that typically patients of polyhydramnios experience frequent uterine contractions as well as increased shortness of breath and abdominal discomfort.  The patient verbalized that she is doing very well and has none of these concerns.  Sonographic findings Single intrauterine pregnancy. Fetal cardiac activity: Observed. Presentation: Cephalic. Interval fetal anatomy appears normal. Amniotic fluid: Polyhydramnios.  MVP: 9.27 cm. Placenta: Anterior Fundal. BPP: 8/8.   Recommendations -Continue weekly antenatal testing until delivery -Delivery can occur  around her EDD    Review of Systems: A review of systems was performed and was negative except per HPI   Vitals and Physical Exam    04/11/2023    3:45 PM 03/21/2023    2:59 PM 03/20/2023    1:25 PM  Vitals with BMI  Weight 155 lbs 149 lbs   Systolic 111 109 469  Diastolic 67 68 64  Pulse 84 84    Sitting comfortably on the sonogram table Nonlabored breathing Normal rate and rhythm Abdomen is nontender  Past pregnancies OB History  Gravida Para Term Preterm AB Living  3 2 2   2   SAB IAB Ectopic Multiple Live Births     0 2    # Outcome Date GA Lbr Len/2nd Weight Sex Type Anes PTL Lv  3 Current           2 Term 09/22/20 [redacted]w[redacted]d  8 lb (3.629 kg) F Vag-Spont None  LIV  1 Term 04/14/16 [redacted]w[redacted]d 11:44 / 02:00 8 lb 10.8 oz (3.935 kg) F Vag-Spont EPI  LIV    I spent 20 minutes reviewing the patients chart, including labs and images as well as counseling the patient about her medical conditions. Greater than 50% of the time was spent in direct face-to-face patient counseling.  Braxton Feathers, DO Maternal fetal medicine, [place]   04/26/2023  12:00 PM

## 2023-04-27 NOTE — Progress Notes (Unsigned)
   PRENATAL VISIT NOTE  Subjective:  Amanda Simmons is a 28 y.o. G3P2002 at [redacted]w[redacted]d being seen today for ongoing prenatal care.  She is currently monitored for the following issues for this low-risk pregnancy and has Attention deficit hyperactivity disorder (ADHD); Depression; GAD (generalized anxiety disorder); OCD (obsessive compulsive disorder); Atypical squamous cells cannot exclude high grade squamous intraepithelial lesion on cytologic smear of cervix (ASC-H); Supervision of high risk pregnancy, antepartum; Abnormal vaginal Pap smear; Request for sterilization; Late prenatal care affecting pregnancy, antepartum; and Polyhydramnios affecting pregnancy in third trimester on their problem list.  Patient reports no complaints.  Contractions: Not present. Vag. Bleeding: None.  Movement: Present. Denies leaking of fluid.   The following portions of the patient's history were reviewed and updated as appropriate: allergies, current medications, past family history, past medical history, past social history, past surgical history and problem list.   Objective:   Vitals:   05/01/23 1542  BP: 118/74  Pulse: 96  Weight: 153 lb (69.4 kg)    Fetal Status: Fetal Heart Rate (bpm): 145   Movement: Present     General:  Alert, oriented and cooperative. Patient is in no acute distress.  Skin: Skin is warm and dry. No rash noted.   Cardiovascular: Normal heart rate noted  Respiratory: Normal respiratory effort, no problems with respiration noted  Abdomen: Soft, gravid, appropriate for gestational age.  Pain/Pressure: Absent     Pelvic: Cervical exam performed in the presence of a chaperone Dilation: 3 Effacement (%): 70 Station: -1  Extremities: Normal range of motion.  Edema: None  Mental Status: Normal mood and affect. Normal behavior. Normal judgment and thought content.   Assessment and Plan:  Pregnancy: G3P2002 at [redacted]w[redacted]d 1. Polyhydramnios affecting pregnancy in third trimester (Primary) Poly  with LGA (normal 2 hr, tested to 8lb11oz). Recommend delivery by [facility]. Pt accepts IOL on 4/1.  Has next [place] for AFI on 3/28.   2. Supervision of high risk pregnancy, antepartum Offered membrane sweep. Pt accepts.  GBS negative  3. Late prenatal care affecting pregnancy, antepartum   Term labor symptoms and general obstetric precautions including but not limited to vaginal bleeding, contractions, leaking of fluid and fetal movement were reviewed in detail with the patient. Please refer to After Visit Summary for other counseling recommendations.   No follow-ups on file.  Future Appointments  Date Time Provider Department Center  05/03/2023  9:30 AM [facility] US4 [facility]    Milas Hock, MD

## 2023-05-01 ENCOUNTER — Ambulatory Visit (INDEPENDENT_AMBULATORY_CARE_PROVIDER_SITE_OTHER): Payer: Medicaid Other | Admitting: Obstetrics and Gynecology

## 2023-05-01 VITALS — BP 118/74 | HR 96 | Wt 153.0 lb

## 2023-05-01 DIAGNOSIS — O0990 Supervision of high risk pregnancy, unspecified, unspecified trimester: Secondary | ICD-10-CM

## 2023-05-01 DIAGNOSIS — O403XX0 Polyhydramnios, third trimester, not applicable or unspecified: Secondary | ICD-10-CM

## 2023-05-01 DIAGNOSIS — Z3A39 39 weeks gestation of pregnancy: Secondary | ICD-10-CM

## 2023-05-01 DIAGNOSIS — O0930 Supervision of pregnancy with insufficient antenatal care, unspecified trimester: Secondary | ICD-10-CM

## 2023-05-02 ENCOUNTER — Other Ambulatory Visit: Payer: Self-pay | Admitting: Obstetrics and Gynecology

## 2023-05-02 ENCOUNTER — Telehealth (HOSPITAL_COMMUNITY): Payer: Self-pay | Admitting: *Deleted

## 2023-05-02 ENCOUNTER — Encounter (HOSPITAL_COMMUNITY): Payer: Self-pay | Admitting: *Deleted

## 2023-05-02 DIAGNOSIS — O403XX0 Polyhydramnios, third trimester, not applicable or unspecified: Secondary | ICD-10-CM

## 2023-05-02 NOTE — Telephone Encounter (Signed)
 Preadmission screen

## 2023-05-03 ENCOUNTER — Ambulatory Visit: Attending: Maternal & Fetal Medicine

## 2023-05-03 DIAGNOSIS — O0930 Supervision of pregnancy with insufficient antenatal care, unspecified trimester: Secondary | ICD-10-CM | POA: Diagnosis present

## 2023-05-03 DIAGNOSIS — O0933 Supervision of pregnancy with insufficient antenatal care, third trimester: Secondary | ICD-10-CM

## 2023-05-03 DIAGNOSIS — Z3A39 39 weeks gestation of pregnancy: Secondary | ICD-10-CM

## 2023-05-03 DIAGNOSIS — O403XX0 Polyhydramnios, third trimester, not applicable or unspecified: Secondary | ICD-10-CM | POA: Diagnosis present

## 2023-05-07 ENCOUNTER — Encounter (HOSPITAL_COMMUNITY): Payer: Self-pay | Admitting: Obstetrics and Gynecology

## 2023-05-07 ENCOUNTER — Inpatient Hospital Stay (HOSPITAL_COMMUNITY)

## 2023-05-07 ENCOUNTER — Inpatient Hospital Stay (HOSPITAL_COMMUNITY)
Admission: RE | Admit: 2023-05-07 | Discharge: 2023-05-08 | DRG: 806 | Disposition: A | Discharge status: Home / Self Care | Attending: Family Medicine | Admitting: Family Medicine

## 2023-05-07 DIAGNOSIS — Z3A40 40 weeks gestation of pregnancy: Secondary | ICD-10-CM

## 2023-05-07 DIAGNOSIS — D62 Acute posthemorrhagic anemia: Secondary | ICD-10-CM | POA: Diagnosis not present

## 2023-05-07 DIAGNOSIS — F411 Generalized anxiety disorder: Secondary | ICD-10-CM | POA: Diagnosis present

## 2023-05-07 DIAGNOSIS — O0990 Supervision of high risk pregnancy, unspecified, unspecified trimester: Principal | ICD-10-CM

## 2023-05-07 DIAGNOSIS — O99344 Other mental disorders complicating childbirth: Secondary | ICD-10-CM | POA: Diagnosis present

## 2023-05-07 DIAGNOSIS — O0933 Supervision of pregnancy with insufficient antenatal care, third trimester: Secondary | ICD-10-CM | POA: Diagnosis not present

## 2023-05-07 DIAGNOSIS — O403XX0 Polyhydramnios, third trimester, not applicable or unspecified: Secondary | ICD-10-CM

## 2023-05-07 DIAGNOSIS — O9081 Anemia of the puerperium: Secondary | ICD-10-CM | POA: Diagnosis not present

## 2023-05-07 DIAGNOSIS — Z87891 Personal history of nicotine dependence: Secondary | ICD-10-CM

## 2023-05-07 DIAGNOSIS — O480 Post-term pregnancy: Secondary | ICD-10-CM | POA: Diagnosis present

## 2023-05-07 DIAGNOSIS — O0930 Supervision of pregnancy with insufficient antenatal care, unspecified trimester: Secondary | ICD-10-CM

## 2023-05-07 DIAGNOSIS — O3663X0 Maternal care for excessive fetal growth, third trimester, not applicable or unspecified: Secondary | ICD-10-CM | POA: Diagnosis present

## 2023-05-07 DIAGNOSIS — F429 Obsessive-compulsive disorder, unspecified: Secondary | ICD-10-CM | POA: Diagnosis present

## 2023-05-07 DIAGNOSIS — Z560 Unemployment, unspecified: Secondary | ICD-10-CM | POA: Diagnosis not present

## 2023-05-07 DIAGNOSIS — O409XX0 Polyhydramnios, unspecified trimester, not applicable or unspecified: Secondary | ICD-10-CM | POA: Diagnosis present

## 2023-05-07 DIAGNOSIS — R87611 Atypical squamous cells cannot exclude high grade squamous intraepithelial lesion on cytologic smear of cervix (ASC-H): Secondary | ICD-10-CM | POA: Diagnosis present

## 2023-05-07 DIAGNOSIS — Z302 Encounter for sterilization: Secondary | ICD-10-CM

## 2023-05-07 LAB — TYPE AND SCREEN
ABO/RH(D): O POS
Antibody Screen: NEGATIVE

## 2023-05-07 LAB — CBC
HCT: 32.4 % — ABNORMAL LOW (ref 36.0–46.0)
Hemoglobin: 10.3 g/dL — ABNORMAL LOW (ref 12.0–15.0)
MCH: 25.8 pg — ABNORMAL LOW (ref 26.0–34.0)
MCHC: 31.8 g/dL (ref 30.0–36.0)
MCV: 81.2 fL (ref 80.0–100.0)
Platelets: 237 10*3/uL (ref 150–400)
RBC: 3.99 MIL/uL (ref 3.87–5.11)
RDW: 13.3 % (ref 11.5–15.5)
WBC: 6.2 10*3/uL (ref 4.0–10.5)
nRBC: 0 % (ref 0.0–0.2)

## 2023-05-07 LAB — RPR: RPR Ser Ql: NONREACTIVE

## 2023-05-07 MED ORDER — SENNOSIDES-DOCUSATE SODIUM 8.6-50 MG PO TABS
2.0000 | ORAL_TABLET | ORAL | Status: DC
  Administered 2023-05-08: 2 via ORAL
  Filled 2023-05-07: qty 2

## 2023-05-07 MED ORDER — ONDANSETRON HCL 4 MG/2ML IJ SOLN
4.0000 mg | Freq: Four times a day (QID) | INTRAMUSCULAR | Status: DC | PRN
Start: 2023-05-07 — End: 2023-05-07

## 2023-05-07 MED ORDER — LIDOCAINE HCL (PF) 1 % IJ SOLN: 30.0000 mL | INTRAMUSCULAR | Status: DC | PRN

## 2023-05-07 MED ORDER — ONDANSETRON HCL 4 MG PO TABS: 4.0000 mg | ORAL_TABLET | ORAL | Status: DC | PRN

## 2023-05-07 MED ORDER — ACETAMINOPHEN 325 MG PO TABS: 650.0000 mg | ORAL_TABLET | ORAL | Status: DC | PRN

## 2023-05-07 MED ORDER — SODIUM CHLORIDE 0.9 % IV SOLN: 250.0000 mL | INTRAVENOUS | Status: DC | PRN

## 2023-05-07 MED ORDER — OXYTOCIN-SODIUM CHLORIDE 30-0.9 UT/500ML-% IV SOLN: 2.5000 [IU]/h | INTRAVENOUS | Status: DC

## 2023-05-07 MED ORDER — PHENYLEPHRINE 80 MCG/ML (10ML) SYRINGE FOR IV PUSH (FOR BLOOD PRESSURE SUPPORT): 80.0000 ug | PREFILLED_SYRINGE | INTRAVENOUS | Status: DC | PRN

## 2023-05-07 MED ORDER — TERBUTALINE SULFATE 1 MG/ML IJ SOLN: 0.2500 mg | Freq: Once | INTRAMUSCULAR | Status: DC | PRN

## 2023-05-07 MED ORDER — TETANUS-DIPHTH-ACELL PERTUSSIS 5-2.5-18.5 LF-MCG/0.5 IM SUSY: 0.5000 mL | PREFILLED_SYRINGE | Freq: Once | INTRAMUSCULAR | Status: DC

## 2023-05-07 MED ORDER — BENZOCAINE-MENTHOL 20-0.5 % EX AERO: 1.0000 | INHALATION_SPRAY | CUTANEOUS | Status: DC | PRN

## 2023-05-07 MED ORDER — OXYTOCIN-SODIUM CHLORIDE 30-0.9 UT/500ML-% IV SOLN
1.0000 m[IU]/min | INTRAVENOUS | Status: DC
  Filled 2023-05-07: qty 500

## 2023-05-07 MED ORDER — PRENATAL MULTIVITAMIN CH
1.0000 | ORAL_TABLET | Freq: Every day | ORAL | Status: DC
  Administered 2023-05-08: 1 via ORAL
  Filled 2023-05-07: qty 1

## 2023-05-07 MED ORDER — LACTATED RINGERS IV SOLN: 500.0000 mL | INTRAVENOUS | Status: DC | PRN

## 2023-05-07 MED ORDER — WITCH HAZEL-GLYCERIN EX PADS: 1.0000 | MEDICATED_PAD | CUTANEOUS | Status: DC | PRN

## 2023-05-07 MED ORDER — OXYTOCIN BOLUS FROM INFUSION
333.0000 mL | Freq: Once | INTRAVENOUS | Status: AC
  Administered 2023-05-07: 333 mL via INTRAVENOUS

## 2023-05-07 MED ORDER — SODIUM CHLORIDE 0.9% FLUSH: 3.0000 mL | INTRAVENOUS | Status: DC | PRN

## 2023-05-07 MED ORDER — IBUPROFEN 600 MG PO TABS
600.0000 mg | ORAL_TABLET | Freq: Four times a day (QID) | ORAL | Status: DC
  Administered 2023-05-07 – 2023-05-08 (×3): 600 mg via ORAL
  Filled 2023-05-07 (×3): qty 1

## 2023-05-07 MED ORDER — COCONUT OIL OIL: 1.0000 | TOPICAL_OIL | Status: DC | PRN

## 2023-05-07 MED ORDER — ZOLPIDEM TARTRATE 5 MG PO TABS: 5.0000 mg | ORAL_TABLET | Freq: Every evening | ORAL | Status: DC | PRN

## 2023-05-07 MED ORDER — DIBUCAINE (PERIANAL) 1 % EX OINT: 1.0000 | TOPICAL_OINTMENT | CUTANEOUS | Status: DC | PRN

## 2023-05-07 MED ORDER — LACTATED RINGERS IV SOLN: 500.0000 mL | Freq: Once | INTRAVENOUS | Status: DC

## 2023-05-07 MED ORDER — SODIUM CHLORIDE 0.9% FLUSH: 3.0000 mL | Freq: Two times a day (BID) | INTRAVENOUS | Status: DC

## 2023-05-07 MED ORDER — OXYCODONE HCL 5 MG PO TABS: 5.0000 mg | ORAL_TABLET | ORAL | Status: DC | PRN

## 2023-05-07 MED ORDER — SIMETHICONE 80 MG PO CHEW: 80.0000 mg | CHEWABLE_TABLET | ORAL | Status: DC | PRN

## 2023-05-07 MED ORDER — SOD CITRATE-CITRIC ACID 500-334 MG/5ML PO SOLN: 30.0000 mL | ORAL | Status: DC | PRN

## 2023-05-07 MED ORDER — DIPHENHYDRAMINE HCL 50 MG/ML IJ SOLN: 12.5000 mg | INTRAMUSCULAR | Status: DC | PRN

## 2023-05-07 MED ORDER — ONDANSETRON HCL 4 MG/2ML IJ SOLN: 4.0000 mg | INTRAMUSCULAR | Status: DC | PRN

## 2023-05-07 MED ORDER — EPHEDRINE 5 MG/ML INJ: 10.0000 mg | INTRAVENOUS | Status: DC | PRN

## 2023-05-07 MED ORDER — LACTATED RINGERS IV SOLN: INTRAVENOUS | Status: DC

## 2023-05-07 MED ORDER — MEASLES, MUMPS & RUBELLA VAC IJ SOLR: 0.5000 mL | Freq: Once | INTRAMUSCULAR | Status: DC

## 2023-05-07 MED ORDER — OXYTOCIN-SODIUM CHLORIDE 30-0.9 UT/500ML-% IV SOLN
1.0000 m[IU]/min | INTRAVENOUS | Status: DC
  Administered 2023-05-07: 2 m[IU]/min via INTRAVENOUS

## 2023-05-07 MED ORDER — IBUPROFEN 600 MG PO TABS
600.0000 mg | ORAL_TABLET | Freq: Once | ORAL | Status: AC
  Administered 2023-05-07: 600 mg via ORAL
  Filled 2023-05-07: qty 1

## 2023-05-07 MED ORDER — FENTANYL-BUPIVACAINE-NACL 0.5-0.125-0.9 MG/250ML-% EP SOLN: 12.0000 mL/h | EPIDURAL | Status: DC | PRN

## 2023-05-07 MED ORDER — DIPHENHYDRAMINE HCL 25 MG PO CAPS: 25.0000 mg | ORAL_CAPSULE | Freq: Four times a day (QID) | ORAL | Status: DC | PRN

## 2023-05-07 MED ORDER — FENTANYL CITRATE (PF) 100 MCG/2ML IJ SOLN
50.0000 ug | INTRAMUSCULAR | Status: DC | PRN
  Administered 2023-05-07 (×2): 100 ug via INTRAVENOUS
  Filled 2023-05-07 (×2): qty 2

## 2023-05-07 NOTE — H&P (Signed)
 OBSTETRIC ADMISSION HISTORY AND PHYSICAL  Amanda Simmons is a 28 y.o. female G43P2002 with IUP at [redacted]w[redacted]d by 30w [place] presenting for IOL for LGA; also with resolved polyhydramnios.   Patient denies any LOF, consistent contractions, VB, decreased fetal movement, headaches, vision changes, CP, SOB, N/V, RUQ pain, or increased edema.  She plans on bottle feeding. She plans for interval BTL and declines bridge for birth control. She received her prenatal care at  [facility] with onset of care at 31 weeks .  Dating: By Georgiann Hahn [place] --->  Estimated Date of Delivery: 05/04/23  Sono:    @[redacted]w[redacted]d , CWD, normal anatomy, cephalic presentation, 3791g, 16% EFW, AC >99%, HC 68%, AFI 33.87 with largest pocket 9.9  @[redacted]w[redacted]d  AFI 20cm (resolved poly), BPP 8/8, cephalic, ant fundal placenta   Prenatal History/Complications: G1 - 2018, SVD at [redacted]w[redacted]d, 3935g G2 - 2022, SVD at [redacted]w[redacted]d, 3629g G3 - current; late to [facility], resolved polyhydramnios, LGA  Past Medical History: Past Medical History:  Diagnosis Date   ADD (attention deficit disorder)    Anxiety    Depression 08/16/2015   PHQ-9 was 11.    OCD Abnormal pap smear with LSIL 11/2021  Past Surgical History: Past Surgical History:  Procedure Laterality Date   MOUTH SURGERY      Obstetrical History: OB History     Gravida  3   Para  2   Term  2   Preterm      AB      Living  2      SAB      IAB      Ectopic      Multiple  0   Live Births  2           Social History Social History   Socioeconomic History   Marital status: Single    Spouse name: Not on file   Number of children: Not on file   Years of education: Not on file   Highest education level: Not on file  Occupational History   Occupation: unemployed  Tobacco Use   Smoking status: Never   Smokeless tobacco: Former  Building services engineer status: Former   Substances: Nicotine, Flavoring   Devices: quit in July  Substance and Sexual Activity   Alcohol use: Not Currently    Drug use: Not Currently    Types: Marijuana    Comment: last use 2021   Sexual activity: Yes    Partners: Male    Birth control/protection: Pill  Other Topics Concern   Not on file  Social History Narrative   Not on file   Social Drivers of Health   Financial Resource Strain: Not on file  Food Insecurity: No Food Insecurity (02/28/2023)   Received from [facility]   Hunger Vital Sign    Worried About Running Out of Food in the Last Year: Never true    Ran Out of Food in the Last Year: Never true  Transportation Needs: No Transportation Needs (02/28/2023)   Received from [facility] - Transportation    Lack of Transportation (Medical): No    Lack of Transportation (Non-Medical): No  Physical Activity: Not on file  Stress: Not on file  Social Connections: Not on file    Family History: Family History  Problem Relation Age of Onset   Healthy Mother    Healthy Father    Diabetes Neg Hx    Heart disease Neg Hx    Hypertension  Neg Hx    Stroke Neg Hx    Cancer Neg Hx     Allergies: Allergies  Allergen Reactions   Trintellix [Vortioxetine]     Made her mad/angry   Venlafaxine Nausea And Vomiting    Medications Prior to Admission  Medication Sig Dispense Refill Last Dose/Taking   Multiple Vitamin (MULTIVITAMIN) capsule Take 1 capsule by mouth daily.        Review of Systems   All systems reviewed and negative except as stated in HPI  Blood pressure 116/78, pulse 83, temperature 98.1 F (36.7 C), temperature source Oral, resp. rate 16, height 5\' 5"  (1.651 m), weight 69.4 kg, currently breastfeeding. General appearance: alert, cooperative, and no distress Lungs: clear to auscultation bilaterally Heart: regular rate and rhythm Abdomen: soft, non-tender; bowel sounds normal Extremities: Homans sign is negative, no sign of DVT  Presentation: cephalic Fetal monitoring: Baseline: 140 bpm, Variability: Good {> 6 bpm), Accelerations: Reactive, and  Decelerations: Absent Uterine activity: Uterine irritability on toco with occasional contraction Dilation: 3 Effacement (%): 70 Station: Ballotable Exam by:: [place], rn   Prenatal labs: ABO, Rh: --/--/O POS (04/01 8295) Antibody: NEG (04/01 0736) Rubella: 3.30 (01/28 1407) RPR: Non Reactive (01/28 1407)  HBsAg: Negative (01/28 1407)  HIV: Non Reactive (01/28 1407)  GBS: Negative/-- (03/06 1547)    Lab Results  Component Value Date   GBS Negative 04/11/2023   2h GTT 72/97/65 Genetic screening  declined Anatomy US normal  There is no immunization history for the selected administration types on file for this patient.  Prenatal Transfer Tool  Maternal Diabetes: No Genetic Screening: Declined Maternal Ultrasounds/Referrals: Other: resolved poly Fetal Ultrasounds or other Referrals:  None Maternal Substance Abuse:  No Significant Maternal Medications:  None Significant Maternal Lab Results: Group B Strep negative Number of Prenatal Visits:greater than 3 verified prenatal visits Maternal Vaccinations: Declined Other Comments:   Late to [facility] (30w)   Results for orders placed or performed during the hospital encounter of 05/07/23 (from the past 24 hours)  CBC   Collection Time: 05/07/23  7:36 AM  Result Value Ref Range   WBC 6.2 4.0 - 10.5 K/uL   RBC 3.99 3.87 - 5.11 MIL/uL   Hemoglobin 10.3 (L) 12.0 - 15.0 g/dL   HCT 62.1 (L) 30.8 - 65.7 %   MCV 81.2 80.0 - 100.0 fL   MCH 25.8 (L) 26.0 - 34.0 pg   MCHC 31.8 30.0 - 36.0 g/dL   RDW 84.6 96.2 - 95.2 %   Platelets 237 150 - 400 K/uL   nRBC 0.0 0.0 - 0.2 %  Type and screen   Collection Time: 05/07/23  7:36 AM  Result Value Ref Range   ABO/RH(D) O POS    Antibody Screen NEG    Sample Expiration      05/10/2023,2359 Performed at [facility], [address], [place], [place] [address]     Patient Active Problem List   Diagnosis Date Noted   Polyhydramnios affecting pregnancy in third trimester 04/18/2023   Late  prenatal care affecting pregnancy, antepartum 03/13/2023   Abnormal vaginal Pap smear 03/05/2023   Request for sterilization 03/05/2023   Supervision of high risk pregnancy, antepartum 03/04/2023   Atypical squamous cells cannot exclude high grade squamous intraepithelial lesion on cytologic smear of cervix (ASC-H) 03/14/2020   OCD (obsessive compulsive disorder) 03/24/2018   GAD (generalized anxiety disorder) 03/28/2017   Depression 08/16/2015   Attention deficit hyperactivity disorder (ADHD) 01/17/2015    Assessment/Plan:  Amanda Simmons is a 28 y.o. G3P2002 at [redacted]w[redacted]d here for IOL for polyhydramnios.  --IOL: Initial SVE 3/70/-3. Plan to start pitocin. --FWB: Category I, reassuring. Continue to monitor. --Resolved polyhydramnios: [place] at [redacted]w[redacted]d shows AFI 33.87 with largest pocket 9.9;  [place] at [redacted]w[redacted]d shows AFI 20.85 with largest pocket 9.03 --LGA: [place] at [redacted]w[redacted]d shows 94% EFW (3791g), AC >99%, HC 68% --GAD/MDD/OCD/ADHD: Recommend early visit for PP mood check --Late to [facility]: First presentation and [place] at 30w --Abnormal pap: 11/2021 pap with LSIL. Plan for repeat pap PP  --Rh pos/GBS neg/ boy (yes)/bottle/interval BTL (papers 03/21/23)   [name], CNM  05/07/2023, 10:16 AM

## 2023-05-07 NOTE — Lactation Note (Signed)
 This note was copied from a baby's chart. Lactation Consultation Note  Patient Name: Amanda Simmons [id number] Date: 05/07/2023 Age:27 hours Reason for consult: Initial assessment  P3- MOB is formula feeding only. Please let LC know if she is needing assistance at any time.  Feeding Mother's Current Feeding Choice: Formula  Consult Status Consult Status: Complete Date: 05/07/23    Dema Severin BS, IBCLC 05/07/2023, 3:38 PM

## 2023-05-07 NOTE — Progress Notes (Signed)
 Amanda Simmons is a 28 y.o. G3P2002 at [redacted]w[redacted]d by ultrasound admitted for induction of labor due to previous polyhydramnios and LGA fetus.  Subjective: Overall feeling well, denies leakage of fluids or bleeding. Discussed positional changes and heat for pain relief, does not desire pain medication until labor has progressed further, would like ROM.   Objective: BP 107/68   Pulse 80   Temp 98.1 F (36.7 C) (Oral)   Resp 18   Ht 5\' 5"  (1.651 m)   Wt 69.4 kg   BMI 25.46 kg/m  No intake/output data recorded. No intake/output data recorded.  FHT:  FHR: 145 bpm, variability: moderate,  accelerations:  Present,  decelerations:  Present early and irregular, coinciding with contractions UC:   regular, every 5-6 minutes SVE:   Dilation: 3.5 Effacement (%): 60, 70 Station: -2 Exam by:: Philipp Deputy, cnm  Labs: Lab Results  Component Value Date   WBC 6.2 05/07/2023   HGB 10.3 (L) 05/07/2023   HCT 32.4 (L) 05/07/2023   MCV 81.2 05/07/2023   PLT 237 05/07/2023    Assessment / Plan: Induction of labor due to resolved polyhydramnios and LGA fetus,  progressing well on pitocin  Labor:  progressing well on pitocin, AROM with copious clear fluids Preeclampsia:  no signs or symptoms of toxicity Fetal Wellbeing:  Category I Pain Control:  Labor support without medications I/D:  n/a Anticipated MOD:  NSVD  Gerrit Heck, DO 05/07/2023, 12:36 PM

## 2023-05-07 NOTE — H&P (Incomplete)
 OBSTETRIC ADMISSION HISTORY AND PHYSICAL  Amanda Simmons is a 28 y.o. female G88P2002 with IUP at [redacted]w[redacted]d by 30w [place] presenting for IOL for polyhydramnios.   Patient denies any LOF, consistent contractions, VB, decreased fetal movement, headaches, vision changes, CP, SOB, N/V, RUQ pain, or increased edema.  She plans on bottle feeding. She plans for interval BTL and declines bridge for birth control. She received her prenatal care at [facility].  Dating: By Georgiann Hahn [place] --->  Estimated Date of Delivery: 05/04/23  Sono:    @[redacted]w[redacted]d , CWD, normal anatomy, cephalic presentation, 3791g, 14% EFW, AC >99%, HC 68%, AFI 33.87 with largest pocket 9.9   Prenatal History/Complications: G1 - 2018, SVD at [redacted]w[redacted]d, 3935g G2 - 2022, SVD at [redacted]w[redacted]d, 3629g G3 - current; late to [facility], polyhydramnios, LGA  Past Medical History: Past Medical History:  Diagnosis Date   ADD (attention deficit disorder)    Anxiety    Depression 08/16/2015   PHQ-9 was 11.    OCD Abnormal pap smear with LSIL 11/2021  Past Surgical History: Past Surgical History:  Procedure Laterality Date   MOUTH SURGERY      Obstetrical History: OB History     Gravida  3   Para  2   Term  2   Preterm      AB      Living  2      SAB      IAB      Ectopic      Multiple  0   Live Births  2           Social History Social History   Socioeconomic History   Marital status: Single    Spouse name: Not on file   Number of children: Not on file   Years of education: Not on file   Highest education level: Not on file  Occupational History   Occupation: unemployed  Tobacco Use   Smoking status: Never   Smokeless tobacco: Former  Building services engineer status: Former   Substances: Nicotine, Flavoring   Devices: quit in July  Substance and Sexual Activity   Alcohol use: Not Currently   Drug use: Not Currently    Types: Marijuana    Comment: last use 2021   Sexual activity: Yes    Partners: Male    Birth  control/protection: Pill  Other Topics Concern   Not on file  Social History Narrative   Not on file   Social Drivers of Health   Financial Resource Strain: Not on file  Food Insecurity: No Food Insecurity (02/28/2023)   [facility]   Hunger Vital Sign    Worried About Running Out of Food in the Last Year: Never true    Ran Out of Food in the Last Year: Never true  Transportation Needs: No Transportation Needs (02/28/2023)   Received from [facility] - Transportation    Lack of Transportation (Medical): No    Lack of Transportation (Non-Medical): No  Physical Activity: Not on file  Stress: Not on file  Social Connections: Not on file    Family History: Family History  Problem Relation Age of Onset   Healthy Mother    Healthy Father    Diabetes Neg Hx    Heart disease Neg Hx    Hypertension Neg Hx    Stroke Neg Hx    Cancer Neg Hx     Allergies: Allergies  Allergen Reactions   Trintellix [  Vortioxetine]     Made her mad/angry   Venlafaxine Nausea And Vomiting    Medications Prior to Admission  Medication Sig Dispense Refill Last Dose/Taking   Multiple Vitamin (MULTIVITAMIN) capsule Take 1 capsule by mouth daily.        Review of Systems   All systems reviewed and negative except as stated in HPI  Blood pressure 106/64, pulse 86, resp. rate 18, height 5\' 5"  (1.651 m), weight 69.4 kg, currently breastfeeding. General appearance: {general exam:16600} Lungs: clear to auscultation bilaterally Heart: regular rate and rhythm Abdomen: soft, non-tender; bowel sounds normal Pelvic: *** Extremities: Homans sign is negative, no sign of DVT DTR's *** Presentation: cephalic Fetal monitoring: Baseline: 140 bpm, Variability: Good {> 6 bpm), Accelerations: Reactive, and Decelerations: Absent Uterine activity: Uterine irritability on toco with occasional contraction     Prenatal labs: ABO, Rh: O/Positive/-- (01/28 1407) Antibody: Negative  (01/28 1407) Rubella: 3.30 (01/28 1407) RPR: Non Reactive (01/28 1407)  HBsAg: Negative (01/28 1407)  HIV: Non Reactive (01/28 1407)  GBS: Negative/-- (03/06 1547)    Lab Results  Component Value Date   GBS Negative 04/11/2023   GTT declined Genetic screening  declined Anatomy US normal  There is no immunization history for the selected administration types on file for this patient.  Prenatal Transfer Tool  Maternal Diabetes: No Genetic Screening: Declined Maternal Ultrasounds/Referrals: {Maternal Ultrasounds / Referrals:20211} Fetal Ultrasounds or other Referrals:  None Maternal Substance Abuse:  {Maternal Substance Abuse:20223} Significant Maternal Medications:  {Significant Maternal Meds:20233} Significant Maternal Lab Results: Group B Strep negative Number of Prenatal Visits:greater than 3 verified prenatal visits Maternal Vaccinations: Declined Other Comments:   Late to [facility] (30w)   No results found for this or any previous visit (from the past 24 hours).  Patient Active Problem List   Diagnosis Date Noted   Polyhydramnios affecting pregnancy 05/07/2023   Polyhydramnios affecting pregnancy in third trimester 04/18/2023   Late prenatal care affecting pregnancy, antepartum 03/13/2023   Abnormal vaginal Pap smear 03/05/2023   Request for sterilization 03/05/2023   Supervision of high risk pregnancy, antepartum 03/04/2023   Atypical squamous cells cannot exclude high grade squamous intraepithelial lesion on cytologic smear of cervix (ASC-H) 03/14/2020   OCD (obsessive compulsive disorder) 03/24/2018   GAD (generalized anxiety disorder) 03/28/2017   Depression 08/16/2015   Attention deficit hyperactivity disorder (ADHD) 01/17/2015    Assessment/Plan:   Amanda Simmons is a 28 y.o. G3P2002 at [redacted]w[redacted]d here for IOL for polyhydramnios.  --IOL: Initial SVE 3/70/-3. Plan to start pitocin.         --Pain management plan: *** --FWB: Category I, reassuring. Continue to  monitor. --Polyhydramnios: [place] at [redacted]w[redacted]d shows AFI 33.87 with largest pocket 9.9. [place] at [redacted]w[redacted]d shows AFI 20.85 with largest pocket 9.03. --LGA: [place] at [redacted]w[redacted]d shows 94% EFW (3791g), AC >99%, HC 68% --GAD/MDD/OCD/ADHD: Recommend early visit for PP mood check --Late to [facility]: First presentation and [place] at 30w --Abnormal pap: 11/2021 pap with LSIL. Plan for repeat pap PP  --Rh pos/GBS neg/ boy (yes)/bottle/interval BTL   Thereasa Solo, MD  05/07/2023, 7:19 AM

## 2023-05-07 NOTE — Discharge Summary (Signed)
 Postpartum Discharge Summary  Date of Service updated***     Patient Name: Amanda Simmons DOB: [birth year] MRN: [id number]  Date of admission: 05/07/2023 Delivery date:05/07/2023 Delivering provider: Cam Hai D Date of discharge: 05/07/2023  Admitting diagnosis: Polyhydramnios affecting pregnancy [O40.9XX0] Intrauterine pregnancy: [redacted]w[redacted]d     Secondary diagnosis:  Active Problems:   SVD (spontaneous vaginal delivery)   GAD (generalized anxiety disorder)   OCD (obsessive compulsive disorder)   Atypical squamous cells cannot exclude high grade squamous intraepithelial lesion on cytologic smear of cervix (ASC-H)   Request for sterilization   Late prenatal care affecting pregnancy, antepartum  Additional problems: none    Discharge diagnosis: Term Pregnancy Delivered                                              Post partum procedures: none Augmentation: AROM and Pitocin Complications: None  Hospital course: Induction of Labor With Vaginal Delivery   28 y.o. yo G3P3003 at [redacted]w[redacted]d was admitted to the hospital 05/07/2023 for induction of labor.  Indication for induction:  LGA; also with resolved polyhydramnios .  Patient had an uncomplicated labor course. Membrane Rupture Time/Date: 12:24 PM,05/07/2023  Delivery Method:Vaginal, Spontaneous Operative Delivery:N/A Episiotomy: None Lacerations:  None Details of delivery can be found in separate delivery note.  Patient had a postpartum course complicated by***. Patient is discharged home 05/07/23.  Newborn Data: Birth date:05/07/2023 Birth time:3:32 PM Gender:Female Living status:Living Apgars:8 ,9  Weight:3910 g  Magnesium Sulfate received: No BMZ received: Yes Rhophylac:N/A MMR:Yes T-DaP:Declined prenatally  Flu: No RSV Vaccine received: No Transfusion:No  Immunizations received: There is no immunization history for the selected administration types on file for this patient.  Physical exam  Vitals:   05/07/23 1541 05/07/23  1601 05/07/23 1617 05/07/23 1632  BP: 108/63 113/68 120/69 106/70  Pulse: 100 84 85 90  Resp:  18    Temp:    98.3 F (36.8 C)  TempSrc:    Oral  Weight:      Height:       General: {Exam; general:21111117} Lochia: {Desc; appropriate/inappropriate:30686::"appropriate"} Uterine Fundus: {Desc; firm/soft:30687} Incision: {Exam; incision:21111123} DVT Evaluation: {Exam; dvt:2111122} Labs: Lab Results  Component Value Date   WBC 6.2 05/07/2023   HGB 10.3 (L) 05/07/2023   HCT 32.4 (L) 05/07/2023   MCV 81.2 05/07/2023   PLT 237 05/07/2023      Latest Ref Rng & Units 12/25/2019   12:00 AM  CMP  Glucose 65 - 99 mg/dL 63   BUN 7 - 25 mg/dL 10   Creatinine 6.96 - 1.10 mg/dL 2.95   Sodium 284 - 132 mmol/L 141   Potassium 3.5 - 5.3 mmol/L 3.7   Chloride 98 - 110 mmol/L 105   CO2 20 - 32 mmol/L 25   Calcium 8.6 - 10.2 mg/dL 9.4   Total Protein 6.1 - 8.1 g/dL 7.4   Total Bilirubin 0.2 - 1.2 mg/dL 0.8   AST 10 - 30 U/L 13   ALT 6 - 29 U/L 7    Edinburgh Score:    10/20/2020   10:06 AM  Edinburgh Postnatal Depression Scale Screening Tool  I have been able to laugh and see the funny side of things. 0  I have looked forward with enjoyment to things. 0  I have blamed myself unnecessarily when things went wrong. 0  I have been  anxious or worried for no good reason. 0  I have felt scared or panicky for no good reason. 0  Things have been getting on top of me. 0  I have been so unhappy that I have had difficulty sleeping. 0  I have felt sad or miserable. 0  I have been so unhappy that I have been crying. 0  The thought of harming myself has occurred to me. 0  Edinburgh Postnatal Depression Scale Total 0   No data recorded  After visit meds:  Allergies as of 05/07/2023       Reactions   Trintellix [vortioxetine]    Made her mad/angry   Venlafaxine Nausea And Vomiting     Med Rec must be completed prior to using this [facility]***        Discharge home in stable  condition Infant Feeding: {Baby feeding:23562} Infant Disposition:{CHL IP OB HOME WITH ZOXWRU:04540} Discharge instruction: per After Visit Summary and Postpartum booklet. Activity: Advance as tolerated. Pelvic rest for 6 weeks.  Diet: routine diet Future Appointments:No future appointments. Follow up Visit:   [name], CNM  P [facility] Support Pool Please schedule this patient for Postpartum visit in: 2-3 weeks with the following provider: Dr Onnie Graham For C/S patients schedule nurse incision check in weeks 2 weeks: no Low risk pregnancy complicated by: LGA, resolved poly Delivery mode:  SVD Anticipated Birth Control:  Plans Interval BTL (wants DP as surgeon) PP Procedures needed: Pap (hx abnormal) Schedule Integrated BH visit: no   05/07/2023 [name], CNM

## 2023-05-08 ENCOUNTER — Other Ambulatory Visit (HOSPITAL_COMMUNITY): Payer: Self-pay

## 2023-05-08 LAB — CBC
HCT: 31.1 % — ABNORMAL LOW (ref 36.0–46.0)
Hemoglobin: 9.8 g/dL — ABNORMAL LOW (ref 12.0–15.0)
MCH: 25.6 pg — ABNORMAL LOW (ref 26.0–34.0)
MCHC: 31.5 g/dL (ref 30.0–36.0)
MCV: 81.2 fL (ref 80.0–100.0)
Platelets: 269 10*3/uL (ref 150–400)
RBC: 3.83 MIL/uL — ABNORMAL LOW (ref 3.87–5.11)
RDW: 13.4 % (ref 11.5–15.5)
WBC: 12.6 10*3/uL — ABNORMAL HIGH (ref 4.0–10.5)
nRBC: 0 % (ref 0.0–0.2)

## 2023-05-08 LAB — BIRTH TISSUE RECOVERY COLLECTION (PLACENTA DONATION)

## 2023-05-08 MED ORDER — ACETAMINOPHEN 325 MG PO TABS
650.0000 mg | ORAL_TABLET | ORAL | 0 refills | Status: AC | PRN
  Filled 2023-05-08: qty 30, 3d supply, fill #0

## 2023-05-08 MED ORDER — FERROUS SULFATE 325 (65 FE) MG PO TABS
325.0000 mg | ORAL_TABLET | ORAL | 3 refills | Status: DC
  Filled 2023-05-08: qty 30, 60d supply, fill #0

## 2023-05-08 MED ORDER — IBUPROFEN 600 MG PO TABS
600.0000 mg | ORAL_TABLET | Freq: Four times a day (QID) | ORAL | 0 refills | Status: AC
  Filled 2023-05-08: qty 30, 8d supply, fill #0

## 2023-05-08 MED ORDER — FERROUS SULFATE 325 (65 FE) MG PO TABS
325.0000 mg | ORAL_TABLET | ORAL | Status: DC
  Administered 2023-05-08: 325 mg via ORAL
  Filled 2023-05-08: qty 1

## 2023-05-08 MED ORDER — SENNOSIDES-DOCUSATE SODIUM 8.6-50 MG PO TABS
2.0000 | ORAL_TABLET | Freq: Two times a day (BID) | ORAL | 0 refills | Status: DC | PRN
  Filled 2023-05-08: qty 30, 8d supply, fill #0

## 2023-05-08 NOTE — Social Work (Signed)
 MOB was referred for history of depression/anxiety.  * Referral screened out by Clinical Social Worker because none of the following criteria appear to apply:  ~ History of anxiety/depression during this pregnancy, or of post-partum depression following prior delivery.  ~ Diagnosis of anxiety and/or depression within last 3 years OR * MOB's symptoms currently being treated with medication and/or therapy.  Per chart review MOB was diagnosed in 2017 and 2019, per OB records no MH concerns noted during pregnancy. Edinburgh=0  Please contact the Clinical Social Worker if needs arise, or by MOB request.  Wende Neighbors, LCSWA Clinical Social Worker [phone]

## 2023-05-08 NOTE — Progress Notes (Signed)
 POSTPARTUM PROGRESS NOTE  Post Partum Day 1  Subjective:  Amanda Simmons is a 28 y.o. G6Y4034 s/p SVD at [redacted]w[redacted]d.  She reports she is doing well. No acute events overnight. She denies any problems with ambulating, voiding or po intake. Denies nausea or vomiting.  Pain is well controlled.  Lochia is appropriate.  Objective: Blood pressure 101/67, pulse 74, temperature 97.8 F (36.6 C), temperature source Oral, resp. rate 18, height 5\' 5"  (1.651 m), weight 69.4 kg, SpO2 100%, unknown if currently breastfeeding.  Physical Exam:  General: alert, cooperative and no distress Chest: no respiratory distress Heart:regular rate, distal pulses intact Uterine Fundus: firm, appropriately tender DVT Evaluation: No calf swelling or tenderness Extremities: trace edema Skin: warm, dry  Recent Labs    05/07/23 0736 05/08/23 0440  HGB 10.3* 9.8*  HCT 32.4* 31.1*    Assessment/Plan: BAILEE METTER is a 28 y.o. V4Q5956 s/p SVD at [redacted]w[redacted]d   PPD#1 - Doing well  Routine postpartum care Acute Blood Loss Anemia, clinically significant - oral iron Contraception: interval tubal Feeding: bottle  Dispo: Plan for discharge today.   LOS: 1 day   Hessie Dibble, MD OB Fellow  05/08/2023, 7:43 AM

## 2023-05-17 ENCOUNTER — Telehealth (HOSPITAL_COMMUNITY): Payer: Self-pay | Admitting: *Deleted

## 2023-05-17 NOTE — Telephone Encounter (Signed)
 05/17/2023  Name: Amanda Simmons MRN: [id number] DOB: 17-Jul-1995  Reason for Call:  Transition of Care Hospital Discharge Call  Contact Status: Patient Contact Status: Complete  Language assistant needed: Interpreter Mode: Interpreter Not Needed        Follow-Up Questions: Do You Have Any Concerns About Your Health As You Heal From Delivery?: No Do You Have Any Concerns About Your Infants Health?: No  Edinburgh Postnatal Depression Scale:  In the Past 7 Days:    PHQ2-9 Depression Scale:     Discharge Follow-up: Edinburgh score requires follow up?:  (declines screening today, says answers are the same as in hospital where she scored "0", endorses she is doing well emotionally) Patient was advised of the following resources:: Support Group, Breastfeeding Support Group (declines postpartum group information via email)  Post-discharge interventions: Reviewed Newborn Safe Sleep Practices  Salena Saner, RN 05/17/2023 12:49

## 2023-05-25 IMAGING — US US MFM UA CORD DOPPLER
1 series · 15 of 28 positions shown · non-contrast
Comparison: none

[Series 1: us mfm ua cord doppler · 35 acquisitions, 15 frames shown]
[im 1/35]
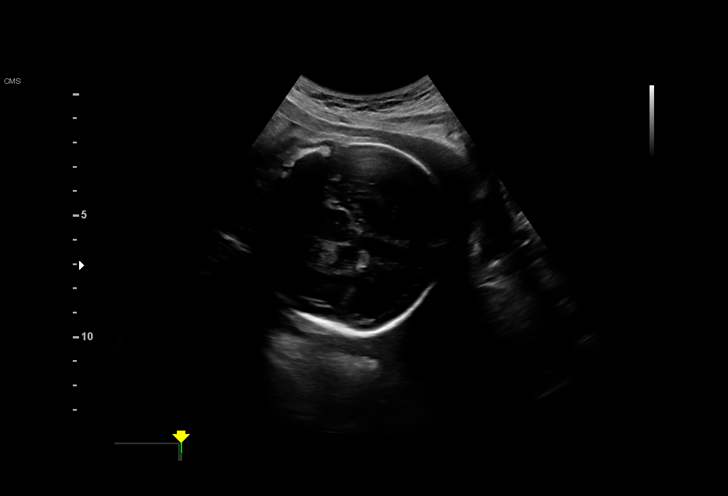
[im 3/35]
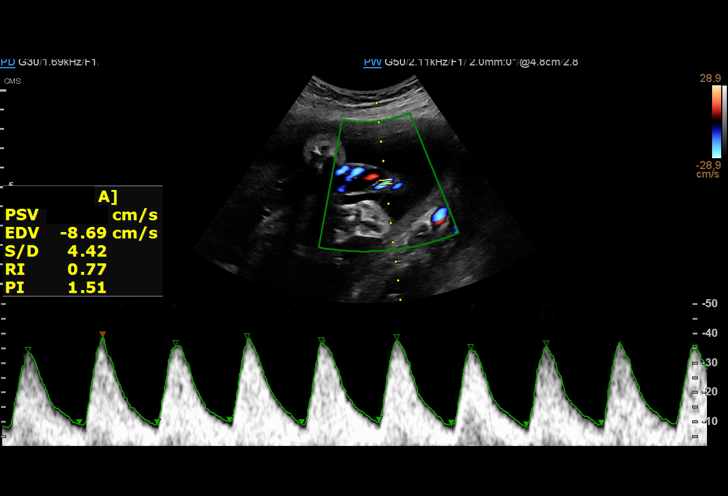
[im 6/35]
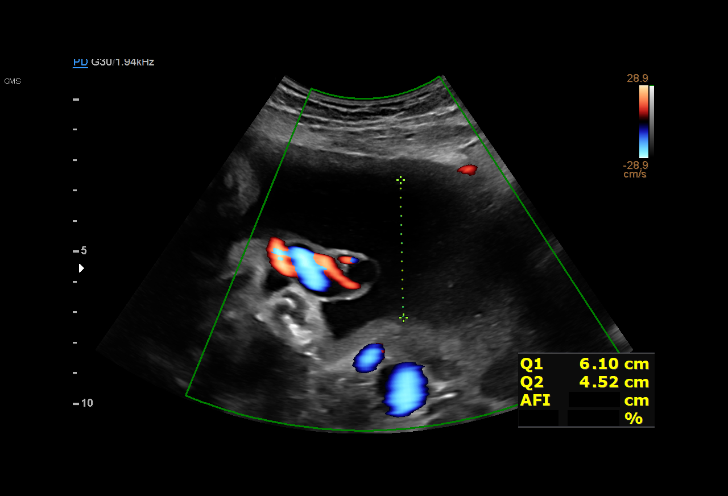
[im 8/35]
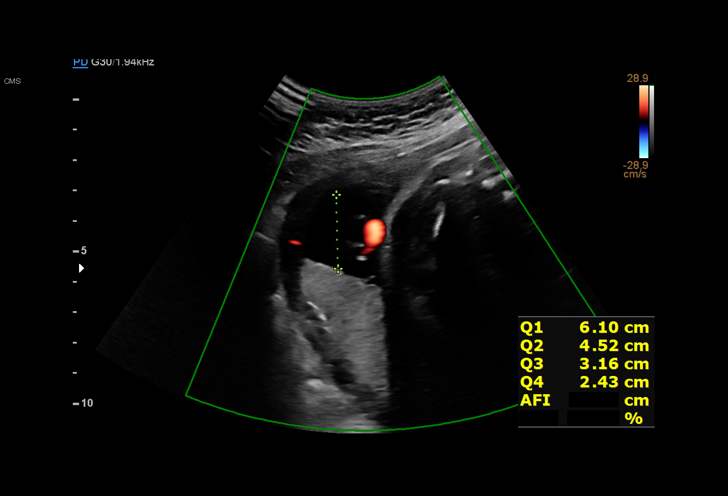
[im 11/35]
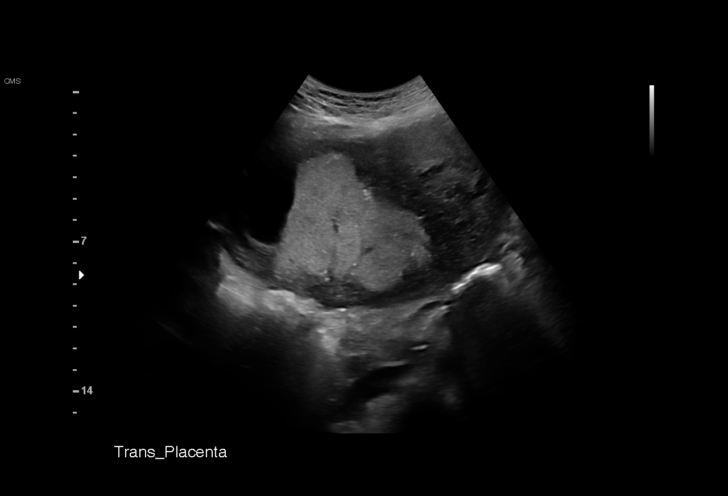
[im 13/35]
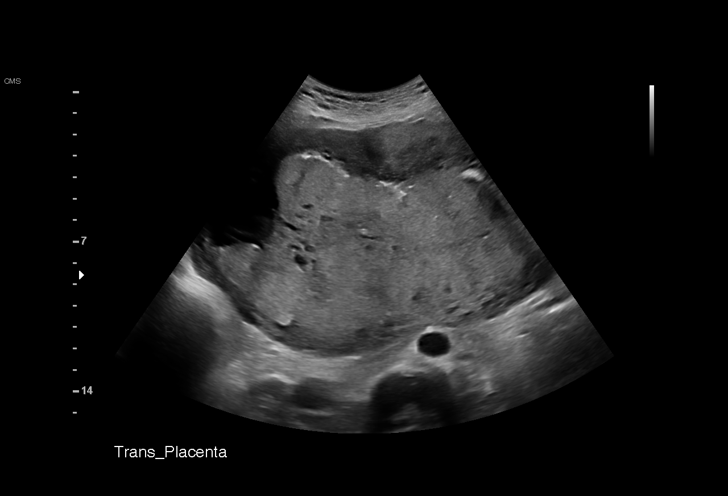
[im 16/35]
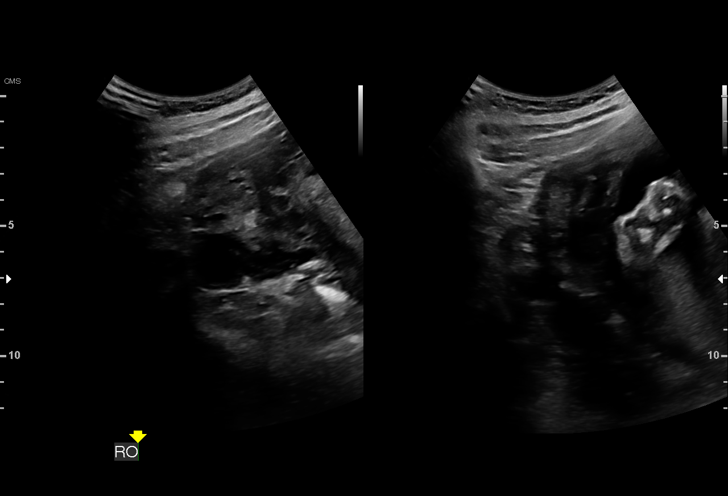
[im 18/35]
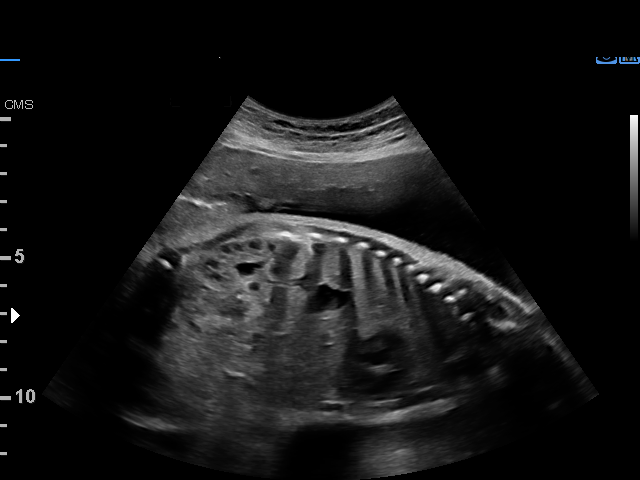
[im 19/35]
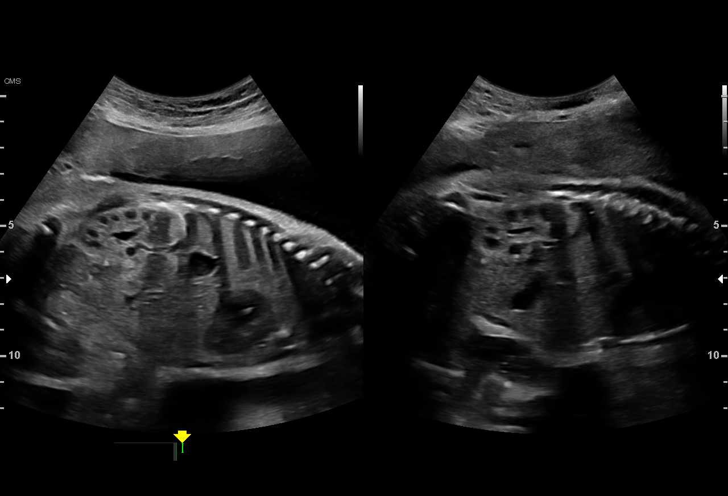
[im 22/35]
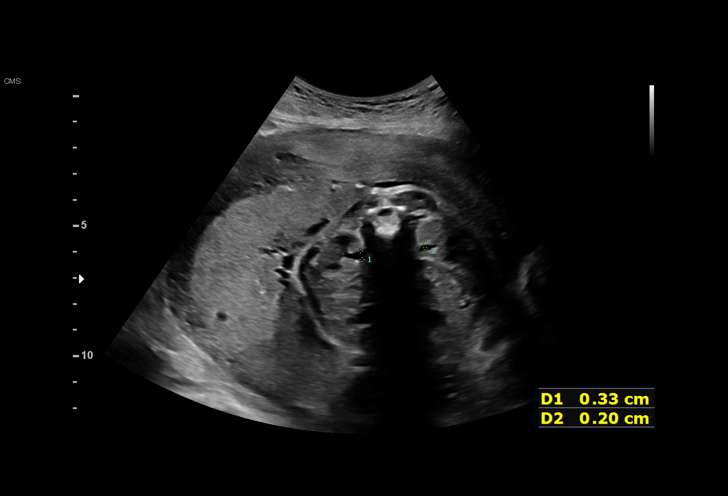
[im 24/35]
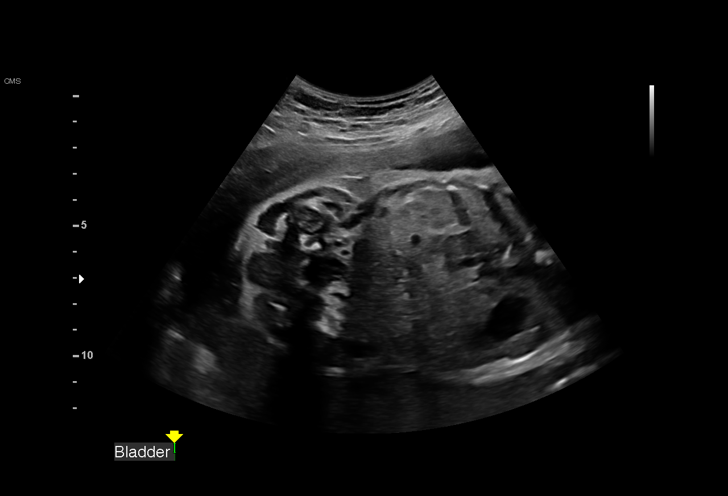
[im 27/35]
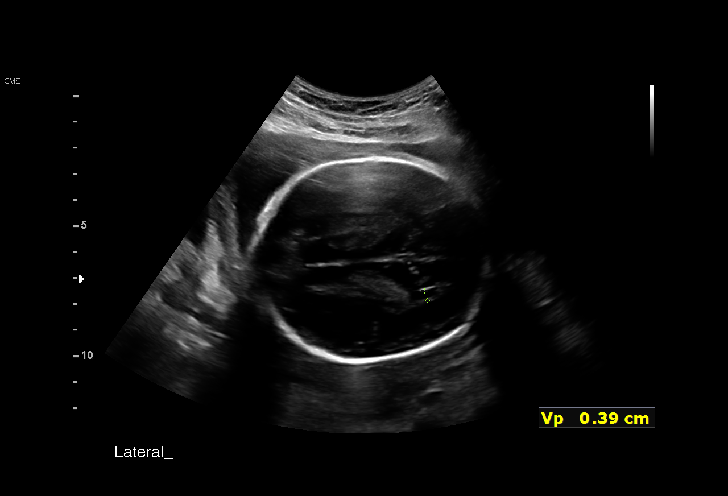
[im 29/35]
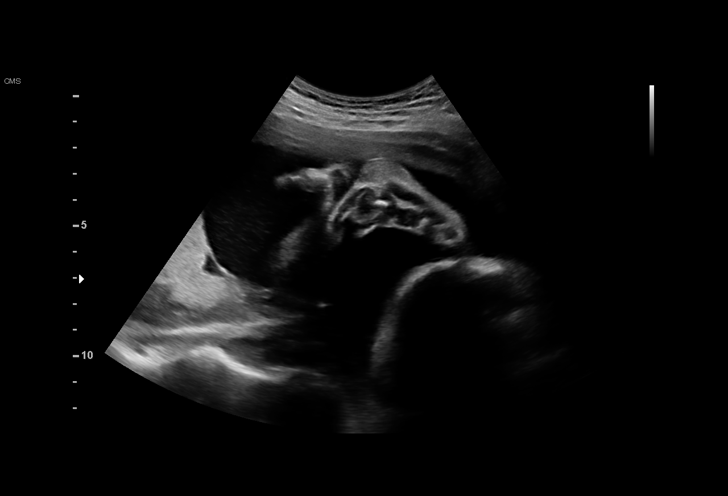
[im 32/35]
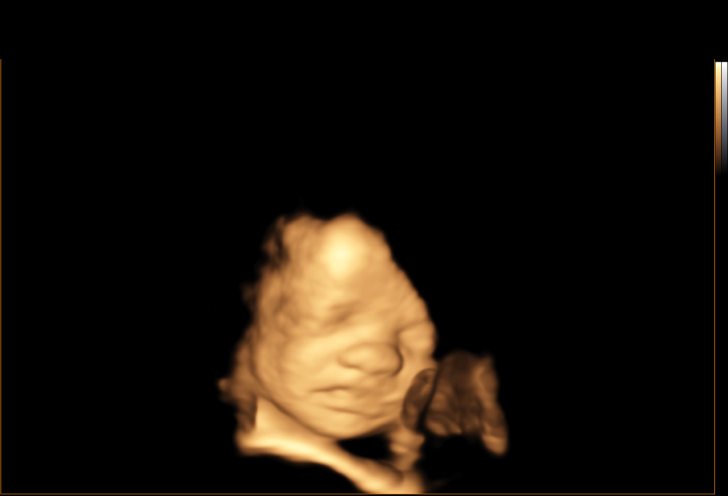
[im 35/35]
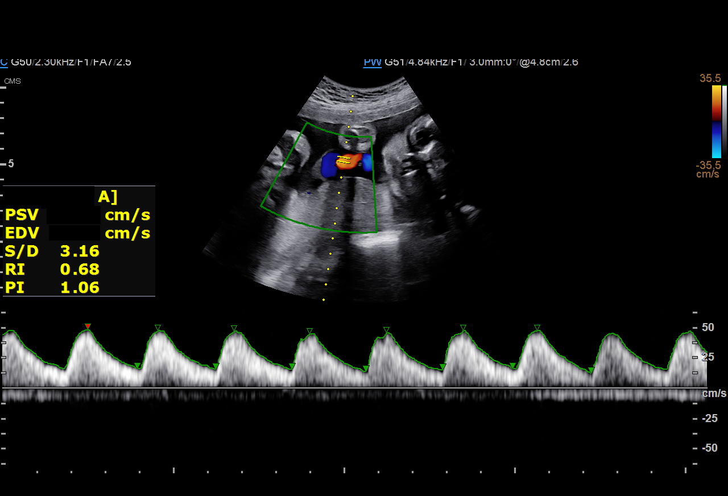

[15 of 28 positions shown; findings below may reference images not displayed]

Indications

 Maternal care for known or suspected poor
 fetal growth, third trimester, not applicable or
 unspecified IUGR
 30 weeks gestation of pregnancy
 Pyelectasis of fetus on prenatal ultrasound
 Low Risk NIPS,Negative Horizon(4 of 4)
Fetal Evaluation

 Num Of Fetuses:         1
 Fetal Heart Rate(bpm):  131
 Cardiac Activity:       Observed
 Presentation:           Cephalic
 Placenta:               Posterior
 P. Cord Insertion:      Previously Visualized

 Amniotic Fluid
 AFI FV:      Within normal limits

 AFI Sum(cm)     %Tile       Largest Pocket(cm)
 16.2            59

 RUQ(cm)       RLQ(cm)       LUQ(cm)        LLQ(cm)

Biometry

 LV:        3.9  mm
OB History
 Gravidity:    2         Term:   1        Prem:   0        SAB:   0
 TOP:          0       Ectopic:  0        Living: 1
Gestational Age

 LMP:           36w 2d        Date:  10/27/19                 EDD:   08/02/20
 Best:          30w 0d     Det. By:  Early Ultrasound         EDD:   09/15/20
                                     (02/25/20)
Anatomy

 Ventricles:            Appears normal         Kidneys:                Appear normal
 Stomach:               Appears normal, left   Bladder:                Appears normal
                        sided
Doppler - Fetal Vessels

 Umbilical Artery
  S/D     %tile      RI    %tile      PI    %tile            ADFV    RDFV
   4.2       96    0.76       95    1.36       97               No      No

Comments

 This patient was seen due to an IUGR fetus.  She denies any
 problems since her last exam.  She reports feeling vigorous
 fetal movements throughout the day.
 Fetal movements were noted throughout today's exam.
 There was normal amniotic fluid noted on today's exam.
 Doppler studies of the umbilical arteries performed due to
 fetal growth restriction showed an elevated S/D ratio of 4.2.
 There were no signs of absent or reversed end-diastolic flow
 noted today.
 The previously noted pyelectasis has resolved on today's
 exam.

 She has another growth ultrasound and umbilical artery
 Doppler study scheduled in 1 week.

## 2023-05-28 ENCOUNTER — Ambulatory Visit: Admitting: Obstetrics and Gynecology

## 2023-05-28 MED ORDER — SLYND 4 MG PO TABS: 1.0000 | ORAL_TABLET | Freq: Every day | ORAL | 2 refills | Status: DC

## 2023-05-28 NOTE — Progress Notes (Signed)
 [name] is a 28 y.o. female status post vaginal delivery on 05/07/23. She is here for a BTL consultation.  She is still bleeding and declines a pap.  She is here alone today with her 3 children.  Unfortunately her visit needs to be scheduled with an MD.   Mood check complete: she reports no anxiousness or concerning depression or anxiety.  She is not breast feeding her infant, however she is breastfeed her 61 year old.   Vitals:   05/28/23 1535  BP: 117/75  Pulse: 82  Height: 5\' 5"  (1.651 m)  Weight: 134 lb (60.8 kg)  BMI (Calculated): 22.3     1. Postpartum state (Primary)  Will Rx BC pills until her visit with Dr. Vallarie Gauze to schedule BTL.  Rx Slynd  Offered in-office depo today and she declined.  Discussed the importance of taking her BC with the 24 hour missed pill window discussed. Pelvic rest until BTL.  Elora Hales, NP 05/28/2023 4:52 PM

## 2023-05-28 NOTE — Progress Notes (Signed)
 Error

## 2023-05-30 ENCOUNTER — Other Ambulatory Visit (HOSPITAL_COMMUNITY): Payer: Self-pay

## 2023-06-11 ENCOUNTER — Other Ambulatory Visit: Payer: Self-pay | Admitting: Obstetrics and Gynecology

## 2023-06-11 DIAGNOSIS — Z302 Encounter for sterilization: Secondary | ICD-10-CM

## 2023-06-11 NOTE — Progress Notes (Signed)
 Amb ref for surgery. Has PP visit scheduled.

## 2023-06-19 ENCOUNTER — Encounter: Payer: Self-pay | Admitting: Obstetrics and Gynecology

## 2023-06-19 ENCOUNTER — Ambulatory Visit: Admitting: Obstetrics and Gynecology

## 2023-06-19 ENCOUNTER — Other Ambulatory Visit (HOSPITAL_COMMUNITY)
Admission: RE | Admit: 2023-06-19 | Discharge: 2023-06-19 | Disposition: A | Discharge status: Home / Self Care | Source: Ambulatory Visit | Attending: Obstetrics and Gynecology | Admitting: Obstetrics and Gynecology

## 2023-06-19 VITALS — BP 121/82 | HR 84 | Ht 65.0 in | Wt 136.0 lb

## 2023-06-19 DIAGNOSIS — Z302 Encounter for sterilization: Secondary | ICD-10-CM | POA: Diagnosis not present

## 2023-06-19 DIAGNOSIS — R87629 Unspecified abnormal cytological findings in specimens from vagina: Secondary | ICD-10-CM | POA: Diagnosis present

## 2023-06-19 MED ORDER — SLYND 4 MG PO TABS: 1.0000 | ORAL_TABLET | Freq: Every day | ORAL | 2 refills | Status: DC

## 2023-06-19 NOTE — Progress Notes (Addendum)
 Post Partum Visit Note  Amanda Simmons is a 28 y.o. G46P3003 female who presents for a postpartum visit. She is 6 week postpartum following a normal spontaneous vaginal delivery.  I have fully reviewed the prenatal and intrapartum course. The delivery was at 40 gestational weeks and 3 days.  Anesthesia: none. Postpartum course has been good. Baby is doing well. Baby is feeding by bottle - Similac Sensitive RS. Bleeding staining only. Bowel function is normal. Bladder function is normal. Patient is not sexually active. Contraception method is tubal ligation. Postpartum depression screening: negative.   The pregnancy intention screening data noted above was reviewed. Potential methods of contraception were discussed. The patient elected to proceed with BTL.    Health Maintenance Due  Topic Date Due   COVID-19 Vaccine (1 - 2024-25 season) Never done    The following portions of the patient's history were reviewed and updated as appropriate: allergies, current medications, past family history, past medical history, past social history, past surgical history, and problem list.  Review of Systems Pertinent items are noted in HPI.  Objective:  There were no vitals taken for this visit.   General:  alert and cooperative   Breasts:  normal  Lungs: Normal respiratory effort  Heart:  regular rate and rhythm, S1, S2 normal, no murmur, click, rub or gallop  GU exam:  normal       Assessment:    1. Abnormal vaginal Pap smear -pap collected today. Discussed possibility of colposcopy but if needed, could do LEEP in OR at time of BTL  2. Request for sterilization -patient still desires tubal but needs family to assist. Will reach out once she knows family availability   3. Postpartum care and examination   normal postpartum exam.   Plan:   Essential components of care per ACOG recommendations:  1.  Mood and well being: Patient with negative depression screening today. Reviewed  local resources for support.  - Patient tobacco use? No.   - hx of drug use? No.    2. Infant care and feeding:  -Patient currently breastmilk feeding? No.  -Social determinants of health (SDOH) reviewed in EPIC. No concerns.  3. Sexuality, contraception and birth spacing - Patient does not want a pregnancy in the next year.  Desired family size is 3 children.  - Reviewed reproductive life planning. Reviewed contraceptive methods based on pt preferences and effectiveness.  Patient desired Female Sterilization today.    4. Sleep and fatigue -Encouraged family/partner/community support of 4 hrs of uninterrupted sleep to help with mood and fatigue  5. Physical Recovery  - Discussed patients delivery and complications. She describes her labor as good. - Patient had a Vaginal, no problems at delivery. Patient had no lacerations. Perineal healing reviewed. Patient expressed understanding - Patient has urinary incontinence? Yes. Discussed role of pelvic floor PT. Occasionally has leaking with sneezing but not often. Will reach out if she desires pelvic PT - Patient is safe to resume physical and sexual activity  6.  Health Maintenance - HM due items addressed Yes - Last pap smear  Diagnosis  Date Value Ref Range Status  11/06/2021 - Low grade squamous intraepithelial lesion (LSIL) (A)  Corrected   Pap smear done at today's visit.  -Breast Cancer screening indicated? No.   7. Chronic Disease/Pregnancy Condition follow up: None  - PCP follow up   Doria Garden, SWHNP 06/19/23   Attestation of Supervision of Student:  I confirm that I have verified the  information documented in the nurse practitioner student's note and that I have also personally reperformed the history, physical exam and all medical decision making activities.  I have verified that all services and findings are accurately documented in this student's note; and I agree with management and plan as outlined in the  documentation. I have also made any necessary editorial changes.  I was present for pap smear. Cervix normal in appearance. Reviewed if positive, I would strongly recommend colposcopy (she has historically not come because of the pain). This would give us  the option for LEEP in OR at the time of her BTS. Patient states she would consider that. She will continue POPs until she has BTS.   Lacey Pian, MD [facility] 06/19/2023 5:02 PM

## 2023-06-19 NOTE — Addendum Note (Signed)
 Addended by: Lacey Pian A on: 06/19/2023 05:04 PM   Modules accepted: Orders

## 2023-06-25 LAB — CYTOLOGY - PAP
Chlamydia: NEGATIVE
Comment: NEGATIVE
Comment: NEGATIVE
Comment: NEGATIVE
Comment: NEGATIVE
Comment: NORMAL
Diagnosis: UNDETERMINED — AB
HPV 16: NEGATIVE
HPV 18 / 45: NEGATIVE
High risk HPV: POSITIVE — AB
Neisseria Gonorrhea: NEGATIVE

## 2023-06-26 ENCOUNTER — Ambulatory Visit: Payer: Self-pay | Admitting: Obstetrics and Gynecology

## 2023-07-17 ENCOUNTER — Encounter: Admitting: Obstetrics and Gynecology

## 2023-08-13 ENCOUNTER — Telehealth: Payer: Self-pay

## 2023-08-13 NOTE — Telephone Encounter (Signed)
 Patient returned call from office. RN reported patient in need of signing Medicaid consent form per surgery scheduler. Patient reported unsure of having the tubal or having the abnormal cells removed. Patient reported still had many questions regarding the procedures and with concerns about recovery time and pain. RN advised for patient to write down questions and offered to schedule a visit with Dr. Cleatus to discuss. Patient reported would call office when ready to schedule to discuss.  Silvano LELON Piano, RN

## 2023-08-13 NOTE — Telephone Encounter (Signed)
 I called pt and left a voicemail for her to call back to our office at [phone]. We will need her to come into the office and sign a consent form

## 2023-09-27 ENCOUNTER — Telehealth: Admitting: Obstetrics and Gynecology

## 2023-09-27 DIAGNOSIS — F53 Postpartum depression: Secondary | ICD-10-CM

## 2023-09-27 DIAGNOSIS — Z8759 Personal history of other complications of pregnancy, childbirth and the puerperium: Secondary | ICD-10-CM | POA: Diagnosis not present

## 2023-09-27 MED ORDER — ALPRAZOLAM 0.5 MG PO TABS: 0.2500 mg | ORAL_TABLET | Freq: Every evening | ORAL | 0 refills | Status: DC | PRN

## 2023-09-27 MED ORDER — SERTRALINE HCL 100 MG PO TABS: 50.0000 mg | ORAL_TABLET | Freq: Every day | ORAL | 1 refills | Status: DC
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
## 2023-10-01 ENCOUNTER — Telehealth: Admitting: Obstetrics and Gynecology

## 2023-10-01 DIAGNOSIS — Z1331 Encounter for screening for depression: Secondary | ICD-10-CM

## 2023-10-01 DIAGNOSIS — F429 Obsessive-compulsive disorder, unspecified: Secondary | ICD-10-CM

## 2023-10-01 DIAGNOSIS — F411 Generalized anxiety disorder: Secondary | ICD-10-CM

## 2023-10-01 NOTE — Progress Notes (Signed)
 RN connected with patient utilizing videochart for follow up per Delon Emms, NP. RN verified speaking with correct patient with two identifiers. Pt reported doing better, mood improved, also now sleeping better at night since starting Zoloft  and Xanax . Pt reported has not reached to counselor yet, works 6 days a week, hopefully will contact this weekend. Pt reported taking Zoloft  50mg  daily at bedtime, aware to increase to 100mg  after taking the 50mg  for one week. Pt also reported taking Alprazolam  0.5 mg at bedtime. with plan to increase. GAD 7=4 and PHQ 9=4. No current concerns reported. Reviewed with Delon Emms, NP, follow up as needed.     10/01/2023    8:13 AM 03/24/2018    8:29 AM 11/05/2017   10:35 AM 03/28/2017    3:57 PM  GAD 7 : Generalized Anxiety Score  Nervous, Anxious, on Edge 1 2 1 1   Control/stop worrying 1 1 1 1   Worry too much - different things 1 1 1 1   Trouble relaxing 0 1 1 0  Restless 0 1 1 0  Easily annoyed or irritable 1 2 1 1   Afraid - awful might happen 0 2 2 0  Total GAD 7 Score 4 10 8 4   Anxiety Difficulty  Somewhat difficult Somewhat difficult Somewhat difficult        10/01/2023    8:14 AM 03/24/2018    8:30 AM 11/05/2017   10:33 AM 03/28/2017    3:54 PM 10/11/2016   10:16 AM  Depression screen PHQ 2/9  Decreased Interest 1 1 1 1 1   Down, Depressed, Hopeless 1 1 1  0 1  PHQ - 2 Score 2 2 2 1 2   Altered sleeping 0 1 1 1 1   Tired, decreased energy 1 1 1 2 1   Change in appetite 0 1 1 1 1   Feeling bad or failure about yourself  1 1 1 1  0  Trouble concentrating 0 0 0 0 1  Moving slowly or fidgety/restless 0 0 0 0 0  Suicidal thoughts 0 0 0 0 0  PHQ-9 Score 4 6 6 6 6   Difficult doing work/chores  Somewhat difficult Somewhat difficult Somewhat difficult Somewhat difficult     Silvano LELON Piano, RN

## 2023-10-11 ENCOUNTER — Telehealth: Payer: Self-pay

## 2023-10-11 NOTE — Telephone Encounter (Signed)
 RN attempted to call patient to follow up on an electronic refill prescription request for Alprazolam  to see if patient was in need as per chart review, refill sent 2 weeks ago. RN left HIPAA compliant voicemail to return RN call.  Silvano LELON Piano, RN

## 2023-10-14 ENCOUNTER — Telehealth: Payer: Self-pay

## 2023-10-14 NOTE — Telephone Encounter (Signed)
 RN spoke with patient who confirmed out of Xanax  and out of Zoloft . RN reached out to [facility]/Becky to follow up. Becky/[facility] pharmacy reported only 10 were dispensed of the Xanax  and then 15 of Sertraline  as they will only dispense a 30 day supply. RN also reached out to provider for review.   Silvano LELON Piano, RN

## 2023-10-15 ENCOUNTER — Other Ambulatory Visit: Payer: Self-pay | Admitting: Obstetrics and Gynecology

## 2023-10-15 MED ORDER — ALPRAZOLAM 0.5 MG PO TABS: 0.2500 mg | ORAL_TABLET | Freq: Every evening | ORAL | 0 refills | Status: DC | PRN

## 2023-10-15 MED ORDER — SERTRALINE HCL 100 MG PO TABS: 100.0000 mg | ORAL_TABLET | Freq: Every day | ORAL | 1 refills | Status: DC

## 2023-10-15 NOTE — Progress Notes (Signed)
 Margueritte called to request refills on xanax  and Zoloft . She is PP. Refills on both sent.  Dorita Nest I, NP 10/15/2023 9:24 AM

## 2023-10-22 ENCOUNTER — Other Ambulatory Visit: Payer: Self-pay | Admitting: Obstetrics and Gynecology

## 2023-12-30 ENCOUNTER — Telehealth: Payer: Self-pay | Admitting: *Deleted

## 2023-12-30 NOTE — Telephone Encounter (Signed)
 Patient left a message that she needs medication refills on Xanax  and Zoloft . I think she will need to get these from her PCP now that she is not pregnant and past 6 months Postpartum.

## 2024-01-08 ENCOUNTER — Other Ambulatory Visit: Payer: Self-pay

## 2024-01-08 MED ORDER — SERTRALINE HCL 100 MG PO TABS: 100.0000 mg | ORAL_TABLET | Freq: Every day | ORAL | 0 refills | Status: DC

## 2024-01-08 NOTE — Telephone Encounter (Signed)
 RN spoke with patient who confirmed in need of refills for Zoloft  and Xanax , received verbal from Delon Emms, NP for Zoloft  refill and that she would refill Xanax  when in office on Friday 01/10/24. RN also informed patient she will need a PCP to refill after this. Patient reported not established with PCP. RN offered to assist with establishing [facility] PCP, patient reported would like to try a provider outside of [facility].  Silvano LELON Piano, RN

## 2024-01-10 ENCOUNTER — Other Ambulatory Visit: Payer: Self-pay | Admitting: Obstetrics and Gynecology

## 2024-01-10 MED ORDER — ALPRAZOLAM 0.5 MG PO TABS: 0.2500 mg | ORAL_TABLET | Freq: Every evening | ORAL | 0 refills | Status: DC | PRN

## 2024-01-10 NOTE — Progress Notes (Signed)
 Amanda Simmons requests refills on Xanax  and Zoloft  for PP depression/anxiety. Rx for both. She will need to follow up with PCP for further Xanax  refills.  Dorita Nest I, NP 01/10/2024 11:04 AM

## 2024-01-14 ENCOUNTER — Telehealth: Payer: Self-pay | Admitting: *Deleted

## 2024-01-14 ENCOUNTER — Other Ambulatory Visit: Payer: Self-pay | Admitting: Obstetrics and Gynecology

## 2024-01-14 DIAGNOSIS — Z302 Encounter for sterilization: Secondary | ICD-10-CM

## 2024-01-14 MED ORDER — SLYND 4 MG PO TABS: 1.0000 | ORAL_TABLET | Freq: Every day | ORAL | 2 refills | Status: DC

## 2024-01-14 NOTE — Telephone Encounter (Signed)
 Needs refills on her B.C. Slynd  today, only has 3 pills left. The pharmacy sent an electronic request, not a fax. Please send refills to [facility] in [place].

## 2024-01-14 NOTE — Addendum Note (Signed)
 Addended by: KAYLA DELON CROME on: 01/14/2024 10:33 AM   Modules accepted: Orders

## 2024-01-31 ENCOUNTER — Ambulatory Visit: Payer: Self-pay

## 2024-01-31 NOTE — Telephone Encounter (Signed)
 FYI Only or Action Required?: FYI only for provider: UC.  Patient was last seen in primary care on NA.  Called Nurse Triage reporting Sore Throat.  Symptoms began several days ago.  Interventions attempted: Nothing.  Symptoms are: unchanged.  Triage Disposition: See Physician Within 24 Hours  Patient/caregiver understands and will follow disposition?: Yes    Copied from CRM #[id number]. Topic: Clinical - Red Word Triage >> Jan 31, 2024  8:04 AM Montie POUR wrote: Red Word that prompted transfer to Nurse Triage:  For the past 2 days, she has had a bad sore throat, feels like she has a fever, congestion, coughing. Pain level at a 7 Reason for Disposition  SEVERE throat pain (e.g., excruciating)  Answer Assessment - Initial Assessment Questions Advised UC now and ED if symptoms worsen. Patient verbalized understanding. Patient reports will go to UC in [place].  Unable to schedule acute visit, new patient appt already scheduled 02/27/24.  1. ONSET: When did the throat start hurting? (Hours or days ago)      2 days 2. SEVERITY: How bad is the sore throat? (Scale 1-10; mild, moderate or severe)     7/10 3. STREP EXPOSURE: Has there been any exposure to strep within the past week? If Yes, ask: What type of contact occurred?      no 4.  VIRAL SYMPTOMS: Are there any symptoms of a cold, such as a runny nose, cough, hoarse voice or red eyes?      Congestion, cough;clear/green 5. FEVER: Do you have a fever? If Yes, ask: What is your temperature, how was it measured, and when did it start?     Possible fever, denies chills, n/v 6. PUS ON THE TONSILS: Is there pus on the tonsils in the back of your throat?     Swollen, rednes 7. OTHER SYMPTOMS: Do you have any other symptoms? (e.g., difficulty breathing, headache, rash)    Intermittent HA Denies diff breathing, chest pain, rash  Protocols used: Sore Throat-A-AH

## 2024-02-13 ENCOUNTER — Telehealth: Payer: Self-pay | Admitting: *Deleted

## 2024-02-13 NOTE — Telephone Encounter (Signed)
 Patient left a message and a MyChart message to the clinical pool for medication for yeast/BV. Please read and contact patient as soon as possible per patient request.

## 2024-02-27 ENCOUNTER — Ambulatory Visit (INDEPENDENT_AMBULATORY_CARE_PROVIDER_SITE_OTHER): Admitting: Urgent Care

## 2024-02-27 VITALS — BP 102/67 | HR 83 | Ht 65.0 in | Wt 161.0 lb

## 2024-02-27 DIAGNOSIS — F411 Generalized anxiety disorder: Secondary | ICD-10-CM

## 2024-02-27 DIAGNOSIS — Z7689 Persons encountering health services in other specified circumstances: Secondary | ICD-10-CM | POA: Diagnosis not present

## 2024-02-27 DIAGNOSIS — F429 Obsessive-compulsive disorder, unspecified: Secondary | ICD-10-CM

## 2024-02-27 DIAGNOSIS — Z3041 Encounter for surveillance of contraceptive pills: Secondary | ICD-10-CM | POA: Diagnosis not present

## 2024-02-27 MED ORDER — NORGESTIMATE-ETH ESTRADIOL 0.18/0.215/0.25 MG-25 MCG PO TABS: 1.0000 | ORAL_TABLET | Freq: Every day | ORAL | 5 refills | Status: AC

## 2024-02-27 MED ORDER — BUSPIRONE HCL 10 MG PO TABS: ORAL_TABLET | ORAL | 2 refills | Status: AC

## 2024-02-27 MED ORDER — SERTRALINE HCL 100 MG PO TABS: 100.0000 mg | ORAL_TABLET | Freq: Every day | ORAL | 0 refills | Status: AC

## 2024-02-27 MED ORDER — ALPRAZOLAM 0.5 MG PO TABS: ORAL_TABLET | ORAL | 0 refills | Status: AC

## 2024-02-27 NOTE — Patient Instructions (Signed)
 Continue sertraline  100mg  for now.  Please add Buspirone  to help with anxiety. Start with 1/2 tab once daily x 5 days, then increase to 1/2 tab twice daily x 5 days, then 1 tab in AM, 1/2 tab in PM x 5 days, then continue with 1 tab twice daily.   Take xanax  as needed at night to help with sleep.  Start birth control combo pill.  Please follow up with me in 4-6 weeks to monitor effectiveness of medication changes today

## 2024-02-27 NOTE — Progress Notes (Signed)
 "  New Patient Office Visit  Subjective:  Patient ID: Amanda Simmons, female    DOB: 03-30-1995  Age: 29 y.o. MRN: [id number]  CC:  Chief Complaint  Patient presents with   Establish Care    HPI Amanda Simmons presents to establish care.  Discussed the use of AI scribe software for clinical note transcription with the patient, who gave verbal consent to proceed.  History of Present Illness   Amanda Simmons is a 29 year old female who presents for medication refills and discussion of menstrual irregularities. She is accompanied by her child, Delsa.  She has experienced irregular menstrual cycles since having children, with inconsistent periods. She skips months without a period and sometimes has two separate three-day periods within a month. She has been on various forms of birth control, including Slynd  (drospirenone ), which she needs refilled. She has also used an IUD in the past, which required a difficult removal.  She is currently taking alprazolam  (Xanax ) for anxiety, which she feels she needs daily and takes at night, helping her feel better throughout the day. She also takes sertraline  (Zoloft ) for anxiety and OCD, but feels it is less effective recently, as she has noticed a return of symptoms such as being on edge and aggressive. She has not tried other medications for these conditions.  She has a history of low iron levels and has been prescribed ferrous sulfate  in the past, but she is not currently taking it. She has not had recent lab work to check her hemoglobin or thyroid levels.  She reports a weight of 161 pounds, which is higher than her usual weight of 135 pounds, associating this weight with her pregnancy history. No history of migraines with auras, active smoking, or blood clots. Her mother has a history of migraines. She has a history of smoking but is not currently smoking. She has three children, including a seven-year-old daughter who is in first grade.        Outpatient Encounter Medications as of 02/27/2024  Medication Sig   busPIRone  (BUSPAR ) 10 MG tablet Start with 1/2 tab once daily x 5 days, then increase to 1/2 tab twice daily x 5 days, then 1 tab in AM, 1/2 tab in PM x 5 days, then continue with 1 tab twice daily.   Norgestimate -Eth Estradiol  (ORTHO TRI-CYCLEN LO) 0.18/0.215/0.25 MG-25 MCG TABS Take 1 tablet by mouth daily.   acetaminophen  (TYLENOL ) 325 MG tablet Take 2 tablets (650 mg total) by mouth every 4 (four) hours as needed (for pain scale < 4).   ALPRAZolam  (XANAX ) 0.5 MG tablet 1/2 to 1 tab PO Q HS PRN   ibuprofen  (ADVIL ) 600 MG tablet Take 1 tablet (600 mg total) by mouth every 6 (six) hours.   Multiple Vitamin (MULTIVITAMIN) capsule Take 1 capsule by mouth daily.   sertraline  (ZOLOFT ) 100 MG tablet Take 1 tablet (100 mg total) by mouth at bedtime.   [DISCONTINUED] ALPRAZolam  (XANAX ) 0.5 MG tablet Take 0.5 tablets (0.25 mg total) by mouth at bedtime as needed for anxiety.   [DISCONTINUED] Drospirenone  (SLYND ) 4 MG TABS Take 1 tablet (4 mg total) by mouth daily at 6 (six) AM.   [DISCONTINUED] Drospirenone  (SLYND ) 4 MG TABS Take 1 tablet (4 mg total) by mouth daily.   [DISCONTINUED] ferrous sulfate  325 (65 FE) MG tablet Take 1 tablet (325 mg total) by mouth every other day.   [DISCONTINUED] senna-docusate (SENOKOT-S) 8.6-50 MG tablet Take 2 tablets by mouth 2 (two) times daily  as needed for mild constipation.   [DISCONTINUED] sertraline  (ZOLOFT ) 100 MG tablet Take 1 tablet (100 mg total) by mouth at bedtime.   No facility-administered encounter medications on file as of 02/27/2024.    Past Medical History:  Diagnosis Date   ADD (attention deficit disorder)    Anxiety    Depression 08/16/2015   PHQ-9 was 11.      Past Surgical History:  Procedure Laterality Date   MOUTH SURGERY      Family History  Problem Relation Age of Onset   Healthy Mother    Healthy Father    Diabetes Neg Hx    Heart disease Neg Hx     Hypertension Neg Hx    Stroke Neg Hx    Cancer Neg Hx     Social History   Socioeconomic History   Marital status: Single    Spouse name: Not on file   Number of children: Not on file   Years of education: Not on file   Highest education level: Not on file  Occupational History   Occupation: unemployed  Tobacco Use   Smoking status: Never   Smokeless tobacco: Former  Building Services Engineer status: Former   Substances: Nicotine, Flavoring   Devices: quit in July  Substance and Sexual Activity   Alcohol use: Not Currently   Drug use: Not Currently    Types: Marijuana    Comment: last use 2021   Sexual activity: Yes    Partners: Male    Birth control/protection: Pill  Other Topics Concern   Not on file  Social History Narrative   Not on file   Social Drivers of Health   Tobacco Use: Medium Risk (02/28/2024)   Patient History    Smoking Tobacco Use: Never    Smokeless Tobacco Use: Former    Passive Exposure: Not on Actuary Strain: Not on file  Food Insecurity: No Food Insecurity (02/28/2023)   Received from [facility]   Epic    Within the past 12 months, you worried that your food would run out before you got the money to buy more.: Never true    Within the past 12 months, the food you bought just didn't last and you didn't have money to get more.: Never true  Transportation Needs: No Transportation Needs (02/28/2023)   Received from [facility] - Transportation    Lack of Transportation (Medical): No    Lack of Transportation (Non-Medical): No  Physical Activity: Not on file  Stress: Not on file  Social Connections: Not on file  Intimate Partner Violence: Not At Risk (02/28/2023)   Received from [facility]   Epic    Within the last year, have you been afraid of your partner or ex-partner?: No    Within the last year, have you been humiliated or emotionally abused in other ways by your partner or ex-partner?: No    Within the last  year, have you been kicked, hit, slapped, or otherwise physically hurt by your partner or ex-partner?: No    Within the last year, have you been raped or forced to have any kind of sexual activity by your partner or ex-partner?: No  Depression (PHQ2-9): Low Risk (02/27/2024)   Depression (PHQ2-9)    PHQ-2 Score: 4  Alcohol Screen: Not on file  Housing: Low Risk (02/28/2023)   Received from [facility]    In the last 12 months, was there a  time when you were not able to pay the mortgage or rent on time?: No    In the past 12 months, how many times have you moved where you were living?: 0    At any time in the past 12 months, were you homeless or living in a shelter (including now)?: No  Utilities: Not At Risk (02/28/2023)   Received from [facility] Utilities    Threatened with loss of utilities: No  Health Literacy: Not on file    ROS: as noted in HPI  Objective:  BP 102/67   Pulse 83   Ht 5' 5 (1.651 m)   Wt 161 lb (73 kg)   SpO2 99%   BMI 26.79 kg/m   Physical Exam Vitals and nursing note reviewed.  Constitutional:      General: She is not in acute distress.    Appearance: Normal appearance. She is not ill-appearing, toxic-appearing or diaphoretic.  HENT:     Head: Normocephalic and atraumatic.     Mouth/Throat:     Mouth: Mucous membranes are moist.  Eyes:     General: No scleral icterus.       Right eye: No discharge.        Left eye: No discharge.     Extraocular Movements: Extraocular movements intact.     Pupils: Pupils are equal, round, and reactive to light.  Cardiovascular:     Rate and Rhythm: Normal rate and regular rhythm.  Pulmonary:     Effort: Pulmonary effort is normal. No respiratory distress.     Breath sounds: Normal breath sounds. No stridor.  Musculoskeletal:     Cervical back: Normal range of motion. No rigidity or tenderness.  Lymphadenopathy:     Cervical: No cervical adenopathy.  Skin:    General: Skin is warm and dry.      Coloration: Skin is not jaundiced.     Findings: No bruising, erythema or rash.  Neurological:     General: No focal deficit present.     Mental Status: She is alert and oriented to person, place, and time.     Gait: Gait normal.  Psychiatric:        Mood and Affect: Mood normal.        Behavior: Behavior normal.     Last CBC Lab Results  Component Value Date   WBC 12.6 (H) 05/08/2023   HGB 9.8 (L) 05/08/2023   HCT 31.1 (L) 05/08/2023   MCV 81.2 05/08/2023   MCH 25.6 (L) 05/08/2023   RDW 13.4 05/08/2023   PLT 269 05/08/2023   Last metabolic panel Lab Results  Component Value Date   GLUCOSE 63 (L) 12/25/2019   NA 141 12/25/2019   K 3.7 12/25/2019   CL 105 12/25/2019   CO2 25 12/25/2019   BUN 10 12/25/2019   CREATININE 0.70 12/25/2019   GFRNONAA 122 12/25/2019   CALCIUM 9.4 12/25/2019   PROT 7.4 12/25/2019   BILITOT 0.8 12/25/2019   AST 13 12/25/2019   ALT 7 12/25/2019   Last lipids No results found for: CHOL, HDL, LDLCALC, LDLDIRECT, TRIG, CHOLHDL Last hemoglobin A1c Lab Results  Component Value Date   HGBA1C 4.9 03/05/2023   Last thyroid functions Lab Results  Component Value Date   TSH 1.39 06/03/2015   Last vitamin D  Lab Results  Component Value Date   VD25OH 36 06/03/2015   Last vitamin B12 and Folate No results found for: VITAMINB12, FOLATE    Assessment &  Plan:  Encounter to establish care -     CBC with Differential/Platelet -     CMP14+EGFR -     Lipid panel -     TSH -     VITAMIN D  25 Hydroxy (Vit-D Deficiency, Fractures) -     Vitamin B12  Obsessive-compulsive disorder, unspecified type -     Sertraline  HCl; Take 1 tablet (100 mg total) by mouth at bedtime.  Dispense: 90 tablet; Refill: 0  GAD (generalized anxiety disorder) -     ALPRAZolam ; 1/2 to 1 tab PO Q HS PRN  Dispense: 15 tablet; Refill: 0 -     busPIRone  HCl; Start with 1/2 tab once daily x 5 days, then increase to 1/2 tab twice daily x 5 days, then 1 tab  in AM, 1/2 tab in PM x 5 days, then continue with 1 tab twice daily.  Dispense: 60 tablet; Refill: 2  Encounter for surveillance of contraceptive pills -     Norgestimate -Eth Estradiol ; Take 1 tablet by mouth daily.  Dispense: 84 tablet; Refill: 5  Assessment and Plan    Irregular menstruation Irregular periods on drospirenone , suitable for combination birth control. - Discontinued drospirenone . - Initiated combination estrogen-progesterone birth control. - Advised potential for irregular periods for two cycles.  Generalized anxiety disorder and obsessive-compulsive disorder Decreased efficacy of sertraline , nightly alprazolam  use. Discussed buspirone  addition. - Refilled alprazolam . - Initiated buspirone . - Continue sertraline .  General Health Maintenance Emphasized lab monitoring due to medication and health changes. - Ordered labs for hemoglobin, thyroid, and other parameters.        Return in about 4 weeks (around 03/26/2024).   Benton LITTIE Gave, PA   "

## 2024-02-28 ENCOUNTER — Encounter: Payer: Self-pay | Admitting: Urgent Care

## 2024-02-28 ENCOUNTER — Ambulatory Visit: Payer: Self-pay | Admitting: Urgent Care

## 2024-02-28 LAB — CMP14+EGFR
ALT: 7 IU/L (ref 0–32)
AST: 17 IU/L (ref 0–40)
Albumin: 4.1 g/dL (ref 4.0–5.0)
Alkaline Phosphatase: 76 IU/L (ref 41–116)
BUN/Creatinine Ratio: 15 (ref 9–23)
BUN: 10 mg/dL (ref 6–20)
Bilirubin Total: 0.2 mg/dL (ref 0.0–1.2)
CO2: 22 mmol/L (ref 20–29)
Calcium: 9.1 mg/dL (ref 8.7–10.2)
Chloride: 106 mmol/L (ref 96–106)
Creatinine, Ser: 0.67 mg/dL (ref 0.57–1.00)
Globulin, Total: 2.9 g/dL (ref 1.5–4.5)
Glucose: 89 mg/dL (ref 70–99)
Potassium: 4.1 mmol/L (ref 3.5–5.2)
Sodium: 143 mmol/L (ref 134–144)
Total Protein: 7 g/dL (ref 6.0–8.5)
eGFR: 123 mL/min/1.73

## 2024-02-28 LAB — CBC WITH DIFFERENTIAL/PLATELET
Basophils Absolute: 0 x10E3/uL (ref 0.0–0.2)
Basos: 1 %
EOS (ABSOLUTE): 0 x10E3/uL (ref 0.0–0.4)
Eos: 0 %
Hematocrit: 37.1 % (ref 34.0–46.6)
Hemoglobin: 12.1 g/dL (ref 11.1–15.9)
Immature Grans (Abs): 0 x10E3/uL (ref 0.0–0.1)
Immature Granulocytes: 0 %
Lymphocytes Absolute: 1.1 x10E3/uL (ref 0.7–3.1)
Lymphs: 29 %
MCH: 28.2 pg (ref 26.6–33.0)
MCHC: 32.6 g/dL (ref 31.5–35.7)
MCV: 87 fL (ref 79–97)
Monocytes Absolute: 0.4 x10E3/uL (ref 0.1–0.9)
Monocytes: 10 %
Neutrophils Absolute: 2.2 x10E3/uL (ref 1.4–7.0)
Neutrophils: 59 %
Platelets: 396 x10E3/uL (ref 150–450)
RBC: 4.29 x10E6/uL (ref 3.77–5.28)
RDW: 12.4 % (ref 11.7–15.4)
WBC: 3.7 x10E3/uL (ref 3.4–10.8)

## 2024-02-28 LAB — LIPID PANEL
Chol/HDL Ratio: 2.7 ratio (ref 0.0–4.4)
Cholesterol, Total: 145 mg/dL (ref 100–199)
HDL: 54 mg/dL
LDL Chol Calc (NIH): 69 mg/dL (ref 0–99)
Triglycerides: 124 mg/dL (ref 0–149)
VLDL Cholesterol Cal: 22 mg/dL (ref 5–40)

## 2024-02-28 LAB — TSH: TSH: 1.35 u[IU]/mL (ref 0.450–4.500)

## 2024-02-28 LAB — VITAMIN D 25 HYDROXY (VIT D DEFICIENCY, FRACTURES): Vit D, 25-Hydroxy: 22.6 ng/mL — ABNORMAL LOW (ref 30.0–100.0)

## 2024-02-28 LAB — VITAMIN B12: Vitamin B-12: 481 pg/mL (ref 232–1245)

## 2024-03-12 ENCOUNTER — Telehealth: Payer: Self-pay

## 2024-03-12 NOTE — Telephone Encounter (Signed)
 Unfortunately both can cause nausea. I would recommend tapering off the buspirone  but remaining on the OCP for now. Start by cutting buspar  in 1/2 and taking 1/2 tab bid x 1 week, then 1/2 tab in AM x 1 week, then stop. She has f/u with me on 2/19 we can discuss if the nause us  resolved at that time

## 2024-03-12 NOTE — Telephone Encounter (Signed)
 Copied from CRM #[id number]. Topic: Clinical - Medication Question >> Mar 12, 2024  4:31 PM Santiya F wrote: Reason for CRM: Patient is calling in because she says ever since her birth control was changed she has been feeling nauseas. Patient says she also had her anxiety medication changed. The birth control is Norgestimate -Eth Estradiol  (ORTHO TRI-CYCLEN LO) 0.18/0.215/0.25 MG-25 MCG TABS [[id number]] and her new anxiety med is busPIRone  (BUSPAR ) 10 MG tablet [[id number]] and she isn't sure which one is making her nauseas. Please advise.

## 2024-03-13 NOTE — Telephone Encounter (Signed)
 Attempted to contact the patient. No answer. Left a brief vm msg for the patient to return a call back to the clinic regarding the provider's recommendation. Direct call back information.

## 2024-03-26 ENCOUNTER — Ambulatory Visit: Admitting: Urgent Care
